# Patient Record
Sex: Male | Born: 1960 | Race: White | Hispanic: No | Smoking: Never smoker
Health system: Southern US, Community
[De-identification: ages and names within clinical notes are randomized; demographics above are authoritative.]

## PROBLEM LIST (undated history)

## (undated) DIAGNOSIS — N200 Calculus of kidney: Secondary | ICD-10-CM

## (undated) HISTORY — PX: APPENDECTOMY: SHX54

---

## 2008-10-24 ENCOUNTER — Emergency Department: Payer: Self-pay | Admitting: Emergency Medicine

## 2010-05-27 ENCOUNTER — Inpatient Hospital Stay: Payer: Self-pay | Admitting: Surgery

## 2013-01-05 ENCOUNTER — Emergency Department: Payer: Self-pay | Admitting: Emergency Medicine

## 2013-01-05 LAB — COMPREHENSIVE METABOLIC PANEL
Albumin: 4.1 g/dL (ref 3.4–5.0)
Anion Gap: 6 — ABNORMAL LOW (ref 7–16)
BUN: 13 mg/dL (ref 7–18)
Bilirubin,Total: 0.7 mg/dL (ref 0.2–1.0)
Calcium, Total: 9.4 mg/dL (ref 8.5–10.1)
Co2: 26 mmol/L (ref 21–32)
Creatinine: 0.95 mg/dL (ref 0.60–1.30)
Glucose: 96 mg/dL (ref 65–99)
Potassium: 3.8 mmol/L (ref 3.5–5.1)
SGOT(AST): 35 U/L (ref 15–37)
SGPT (ALT): 49 U/L (ref 12–78)
Sodium: 138 mmol/L (ref 136–145)
Total Protein: 8 g/dL (ref 6.4–8.2)

## 2013-01-05 LAB — PRO B NATRIURETIC PEPTIDE: B-Type Natriuretic Peptide: 10 pg/mL (ref 0–125)

## 2013-01-05 LAB — CBC
HCT: 46.9 % (ref 40.0–52.0)
HGB: 17.1 g/dL (ref 13.0–18.0)
MCHC: 36.5 g/dL — ABNORMAL HIGH (ref 32.0–36.0)
RBC: 5.2 10*6/uL (ref 4.40–5.90)
WBC: 6.1 10*3/uL (ref 3.8–10.6)

## 2013-01-05 LAB — PROTIME-INR: INR: 0.9

## 2013-01-05 LAB — TROPONIN I
Troponin-I: <0.02 ng/mL
Troponin-I: <0.02 ng/mL

## 2013-01-05 LAB — CK TOTAL AND CKMB (NOT AT ARMC): CK-MB: 3.7 ng/mL — ABNORMAL HIGH (ref 0.5–3.6)

## 2013-05-13 ENCOUNTER — Emergency Department: Payer: Self-pay | Admitting: Emergency Medicine

## 2013-05-13 LAB — COMPREHENSIVE METABOLIC PANEL
Albumin: 4 g/dL (ref 3.4–5.0)
Alkaline Phosphatase: 77 U/L
Anion Gap: 3 — ABNORMAL LOW (ref 7–16)
BUN: 22 mg/dL — ABNORMAL HIGH (ref 7–18)
Bilirubin,Total: 0.6 mg/dL (ref 0.2–1.0)
CALCIUM: 9.5 mg/dL (ref 8.5–10.1)
CHLORIDE: 105 mmol/L (ref 98–107)
CO2: 29 mmol/L (ref 21–32)
CREATININE: 1.03 mg/dL (ref 0.60–1.30)
EGFR (African American): >60
EGFR (Non-African Amer.): >60
Glucose: 94 mg/dL (ref 65–99)
OSMOLALITY: 277 (ref 275–301)
POTASSIUM: 4 mmol/L (ref 3.5–5.1)
SGOT(AST): 34 U/L (ref 15–37)
SGPT (ALT): 56 U/L (ref 12–78)
Sodium: 137 mmol/L (ref 136–145)
TOTAL PROTEIN: 7.8 g/dL (ref 6.4–8.2)

## 2013-05-13 LAB — LIPASE, BLOOD: LIPASE: 149 U/L (ref 73–393)

## 2013-05-13 LAB — URINALYSIS, COMPLETE
BACTERIA: NONE SEEN
Bilirubin,UR: NEGATIVE
GLUCOSE, UR: NEGATIVE mg/dL (ref 0–75)
Ketone: NEGATIVE
NITRITE: NEGATIVE
Ph: 5 (ref 4.5–8.0)
Protein: NEGATIVE
SPECIFIC GRAVITY: 1.021 (ref 1.003–1.030)
SQUAMOUS EPITHELIAL: NONE SEEN

## 2013-05-13 LAB — TROPONIN I

## 2013-05-13 LAB — CBC
HCT: 46.1 % (ref 40.0–52.0)
HGB: 16.1 g/dL (ref 13.0–18.0)
MCH: 32.4 pg (ref 26.0–34.0)
MCHC: 34.9 g/dL (ref 32.0–36.0)
MCV: 93 fL (ref 80–100)
Platelet: 252 10*3/uL (ref 150–440)
RBC: 4.96 10*6/uL (ref 4.40–5.90)
RDW: 12.5 % (ref 11.5–14.5)
WBC: 6.5 10*3/uL (ref 3.8–10.6)

## 2014-08-31 ENCOUNTER — Encounter: Payer: Self-pay | Admitting: Emergency Medicine

## 2014-08-31 ENCOUNTER — Inpatient Hospital Stay
Admission: EM | Admit: 2014-08-31 | Discharge: 2014-09-01 | DRG: 694 | Disposition: A | Discharge status: Home / Self Care | Payer: Self-pay | Attending: Internal Medicine | Admitting: Internal Medicine

## 2014-08-31 ENCOUNTER — Emergency Department: Payer: Self-pay

## 2014-08-31 DIAGNOSIS — N390 Urinary tract infection, site not specified: Secondary | ICD-10-CM

## 2014-08-31 DIAGNOSIS — N201 Calculus of ureter: Secondary | ICD-10-CM

## 2014-08-31 DIAGNOSIS — N179 Acute kidney failure, unspecified: Secondary | ICD-10-CM

## 2014-08-31 DIAGNOSIS — N132 Hydronephrosis with renal and ureteral calculous obstruction: Principal | ICD-10-CM | POA: Diagnosis present

## 2014-08-31 DIAGNOSIS — R109 Unspecified abdominal pain: Secondary | ICD-10-CM | POA: Diagnosis present

## 2014-08-31 DIAGNOSIS — N1330 Unspecified hydronephrosis: Secondary | ICD-10-CM | POA: Diagnosis present

## 2014-08-31 DIAGNOSIS — N200 Calculus of kidney: Secondary | ICD-10-CM

## 2014-08-31 DIAGNOSIS — Z9049 Acquired absence of other specified parts of digestive tract: Secondary | ICD-10-CM | POA: Diagnosis present

## 2014-08-31 DIAGNOSIS — E876 Hypokalemia: Secondary | ICD-10-CM | POA: Diagnosis present

## 2014-08-31 DIAGNOSIS — E860 Dehydration: Secondary | ICD-10-CM | POA: Diagnosis present

## 2014-08-31 DIAGNOSIS — Z87442 Personal history of urinary calculi: Secondary | ICD-10-CM

## 2014-08-31 HISTORY — DX: Calculus of kidney: N20.0

## 2014-08-31 LAB — COMPREHENSIVE METABOLIC PANEL
ALT: 36 U/L (ref 17–63)
AST: 26 U/L (ref 15–41)
Albumin: 4.5 g/dL (ref 3.5–5.0)
Alkaline Phosphatase: 68 U/L (ref 38–126)
Anion gap: 10 (ref 5–15)
BUN: 22 mg/dL — AB (ref 6–20)
CALCIUM: 9.2 mg/dL (ref 8.9–10.3)
CO2: 25 mmol/L (ref 22–32)
Chloride: 106 mmol/L (ref 101–111)
Creatinine, Ser: 1.3 mg/dL — ABNORMAL HIGH (ref 0.61–1.24)
GFR calc non Af Amer: >60 mL/min (ref >60)
Glucose, Bld: 109 mg/dL — ABNORMAL HIGH (ref 65–99)
POTASSIUM: 3.2 mmol/L — AB (ref 3.5–5.1)
Sodium: 141 mmol/L (ref 135–145)
Total Bilirubin: 1 mg/dL (ref 0.3–1.2)
Total Protein: 7.5 g/dL (ref 6.5–8.1)

## 2014-08-31 LAB — CBC WITH DIFFERENTIAL/PLATELET
BASOS PCT: 1 %
Basophils Absolute: 0.1 10*3/uL (ref 0–0.1)
EOS PCT: 2 %
Eosinophils Absolute: 0.2 10*3/uL (ref 0–0.7)
HEMATOCRIT: 44.1 % (ref 40.0–52.0)
Hemoglobin: 15.6 g/dL (ref 13.0–18.0)
LYMPHS ABS: 2.5 10*3/uL (ref 1.0–3.6)
Lymphocytes Relative: 30 %
MCH: 32.4 pg (ref 26.0–34.0)
MCHC: 35.4 g/dL (ref 32.0–36.0)
MCV: 91.3 fL (ref 80.0–100.0)
MONOS PCT: 9 %
Monocytes Absolute: 0.8 10*3/uL (ref 0.2–1.0)
NEUTROS ABS: 4.8 10*3/uL (ref 1.4–6.5)
NEUTROS PCT: 58 %
PLATELETS: 274 10*3/uL (ref 150–440)
RBC: 4.83 MIL/uL (ref 4.40–5.90)
RDW: 12.3 % (ref 11.5–14.5)
WBC: 8.3 10*3/uL (ref 3.8–10.6)

## 2014-08-31 LAB — URINALYSIS COMPLETE WITH MICROSCOPIC (ARMC ONLY)
Bacteria, UA: NONE SEEN
Bilirubin Urine: NEGATIVE
Glucose, UA: NEGATIVE mg/dL
Leukocytes, UA: NEGATIVE
Nitrite: POSITIVE — AB
Protein, ur: 100 mg/dL — AB
Specific Gravity, Urine: 1.027 (ref 1.005–1.030)
Squamous Epithelial / LPF: NONE SEEN
pH: 7 (ref 5.0–8.0)

## 2014-08-31 LAB — LIPASE, BLOOD: Lipase: 24 U/L (ref 22–51)

## 2014-08-31 MED ORDER — TAMSULOSIN HCL 0.4 MG PO CAPS
0.4000 mg | ORAL_CAPSULE | Freq: Once | ORAL | Status: AC
  Administered 2014-08-31: 0.4 mg via ORAL
  Filled 2014-08-31: qty 1

## 2014-08-31 MED ORDER — ONDANSETRON HCL 4 MG/2ML IJ SOLN
4.0000 mg | Freq: Once | INTRAMUSCULAR | Status: AC
  Administered 2014-08-31: 4 mg via INTRAVENOUS

## 2014-08-31 MED ORDER — SODIUM CHLORIDE 0.9 % IV SOLN
INTRAVENOUS | Status: DC
  Administered 2014-08-31 – 2014-09-01 (×2): via INTRAVENOUS

## 2014-08-31 MED ORDER — HYDROMORPHONE HCL 1 MG/ML IJ SOLN
1.0000 mg | Freq: Once | INTRAMUSCULAR | Status: AC
  Administered 2014-08-31: 1 mg via INTRAVENOUS

## 2014-08-31 MED ORDER — CIPROFLOXACIN IN D5W 400 MG/200ML IV SOLN: 400.0000 mg | Freq: Once | INTRAVENOUS | Status: DC

## 2014-08-31 MED ORDER — ONDANSETRON HCL 4 MG PO TABS: 4.0000 mg | ORAL_TABLET | Freq: Four times a day (QID) | ORAL | Status: DC | PRN

## 2014-08-31 MED ORDER — HYDROMORPHONE HCL 1 MG/ML IJ SOLN
INTRAMUSCULAR | Status: AC
  Administered 2014-08-31: 1 mg via INTRAVENOUS
  Filled 2014-08-31: qty 1

## 2014-08-31 MED ORDER — KETOROLAC TROMETHAMINE 30 MG/ML IJ SOLN
30.0000 mg | Freq: Once | INTRAMUSCULAR | Status: AC
  Administered 2014-08-31: 30 mg via INTRAVENOUS
  Filled 2014-08-31: qty 1

## 2014-08-31 MED ORDER — ONDANSETRON HCL 4 MG/2ML IJ SOLN
INTRAMUSCULAR | Status: AC
  Administered 2014-08-31: 4 mg via INTRAVENOUS
  Filled 2014-08-31: qty 2

## 2014-08-31 MED ORDER — OXYCODONE HCL 5 MG PO TABS
5.0000 mg | ORAL_TABLET | ORAL | Status: DC | PRN
  Administered 2014-08-31 – 2014-09-01 (×3): 5 mg via ORAL
  Filled 2014-08-31 (×3): qty 1

## 2014-08-31 MED ORDER — ONDANSETRON HCL 4 MG/2ML IJ SOLN
4.0000 mg | Freq: Four times a day (QID) | INTRAMUSCULAR | Status: DC | PRN
  Administered 2014-09-01: 07:00:00 4 mg via INTRAVENOUS
  Filled 2014-08-31: qty 2

## 2014-08-31 MED ORDER — ACETAMINOPHEN 650 MG RE SUPP: 650.0000 mg | Freq: Four times a day (QID) | RECTAL | Status: DC | PRN

## 2014-08-31 MED ORDER — CEFTRIAXONE SODIUM IN DEXTROSE 20 MG/ML IV SOLN
1.0000 g | INTRAVENOUS | Status: DC
  Administered 2014-08-31: 1 g via INTRAVENOUS
  Filled 2014-08-31 (×2): qty 50

## 2014-08-31 MED ORDER — SODIUM CHLORIDE 0.9 % IV BOLUS (SEPSIS)
1000.0000 mL | Freq: Once | INTRAVENOUS | Status: AC
  Administered 2014-08-31: 1000 mL via INTRAVENOUS

## 2014-08-31 MED ORDER — HYDROMORPHONE HCL 1 MG/ML IJ SOLN
1.0000 mg | INTRAMUSCULAR | Status: DC | PRN
  Administered 2014-09-01: 07:00:00 1 mg via INTRAVENOUS
  Filled 2014-08-31: qty 1

## 2014-08-31 MED ORDER — ACETAMINOPHEN 325 MG PO TABS: 650.0000 mg | ORAL_TABLET | Freq: Four times a day (QID) | ORAL | Status: DC | PRN

## 2014-08-31 NOTE — ED Provider Notes (Signed)
[facility] Emergency Department Provider Note  ____________________________________________  Time seen: Upon arrival to the emergency department  I have reviewed the triage vital signs and the nursing notes.   HISTORY  Chief Complaint Flank Pain    HPI Sean Norton is a 54 y.o. male with a history of multiple kidney stones presents with left flank and left-sided abdominal pain over the past week. He said the pain started last Thursday in his left flank and has progressed, becoming more severe. He said today that his pain is severe to left flank and radiating around to his left side of the abdomen. He had an episode of vomiting at home. No diarrhea. Has never needed to procedures in the past to pass his stones. Does not have a urologist or primary care doctor. Denies any blood in his urine or difficulty urinating. Did say he took several Azo's at home because he says that helped last time he had a kidney stone.   Past Medical History  Diagnosis Date  . Kidney stones     There are no active problems to display for this patient.   Past Surgical History  Procedure Laterality Date  . Appendectomy      No current outpatient prescriptions on file.  Allergies Review of patient's allergies indicates no known allergies.  History reviewed. No pertinent family history.  Social History History  Substance Use Topics  . Smoking status: Never Smoker   . Smokeless tobacco: Never Used  . Alcohol Use: No    Review of Systems Constitutional: No fever/chills Eyes: No visual changes. ENT: No sore throat. Cardiovascular: Denies chest pain. Respiratory: Denies shortness of breath. Gastrointestinal: Entry: Left-sided abdominal pain  No diarrhea.  No constipation. Genitourinary: Negative for dysuria. Musculoskeletal: Left-sided pain over the CVA Skin: Negative for rash. Neurological: Negative for headaches, focal weakness or numbness.  10-point ROS otherwise  negative.  ____________________________________________   PHYSICAL EXAM:  VITAL SIGNS: ED Triage Vitals  Enc Vitals Group     BP 08/31/14 1951 136/103 mmHg     Pulse Rate 08/31/14 1951 71     Resp 08/31/14 1951 28     Temp 08/31/14 1951 97.7 F (36.5 C)     Temp Source 08/31/14 1951 Oral     SpO2 08/31/14 1951 100 %     Weight 08/31/14 1951 255 lb (115.667 kg)     Height 08/31/14 1951 6\' 3"  (1.905 m)     Head Cir --      Peak Flow --      Pain Score 08/31/14 1952 10     Pain Loc --      Pain Edu? --      Excl. in Center? --     Constitutional: Alert and oriented. Moderate to severe distress.  Eyes: Conjunctivae are normal. PERRL. EOMI. Head: Atraumatic. Nose: No congestion/rhinnorhea. Mouth/Throat: Mucous membranes are moist.  Oropharynx non-erythematous. Neck: No stridor.   Cardiovascular: Normal rate, regular rhythm. Grossly normal heart sounds.  Good peripheral circulation. Respiratory: Normal respiratory effort.  No retractions. Lungs CTAB. Gastrointestinal: Soft with diffuse tenderness to palpation which is worst in the left lower quadrant.  No rebound. No distention. No abdominal bruits. No CVA tenderness. Musculoskeletal: No lower extremity tenderness nor edema.  No joint effusions. Neurologic:  Normal speech and language. No gross focal neurologic deficits are appreciated. No gait instability. Skin:  Skin is warm, dry and intact. No rash noted. Psychiatric: Mood and affect are normal. Speech and behavior are  normal.  ____________________________________________   LABS (all labs ordered are listed, but only abnormal results are displayed)  Labs Reviewed  URINALYSIS COMPLETEWITH MICROSCOPIC ([facility]) - Abnormal; Notable for the following:    Color, Urine AMBER (*)    APPearance CLEAR (*)    Ketones, ur TRACE (*)    Hgb urine dipstick 2+ (*)    Protein, ur 100 (*)    Nitrite POSITIVE (*)    All other components within normal limits  COMPREHENSIVE METABOLIC  PANEL - Abnormal; Notable for the following:    Potassium 3.2 (*)    Glucose, Bld 109 (*)    BUN 22 (*)    Creatinine, Ser 1.30 (*)    All other components within normal limits  CBC WITH DIFFERENTIAL/PLATELET  LIPASE, BLOOD   ____________________________________________  EKG   ____________________________________________  RADIOLOGY  6 mm left UVJ stone with mild Hydro. ____________________________________________   PROCEDURES    ____________________________________________   INITIAL IMPRESSION / ASSESSMENT AND PLAN / ED COURSE  Pertinent labs & imaging results that were available during my care of the patient were reviewed by me and considered in my medical decision making (see chart for details).  ----------------------------------------- 9:29 PM on 08/31/2014 -----------------------------------------  Patient still with severe sharp pain and vomiting after 2 mg of Dilaudid and 30 mg of Toradol. Wrote for Flomax as well. We'll admit to the hospital. Discussed cased with Dr. Junious Silk of urology who is aware of the patient. Admitting for intractable pain secondary to kidney stone. Also appears that the urine is infected. Signed out to Dr. Volanda Napoleon. ____________________________________________   FINAL CLINICAL IMPRESSION(S) / ED DIAGNOSES  Acute ureterolithiasis with UTI. Initial visit.    [name], MD 08/31/14 2130

## 2014-08-31 NOTE — H&P (Signed)
[place] [facility] [place] NAME: Sean Norton    MR#:  [id number]  DATE OF BIRTH:  09/13/1960   DATE OF ADMISSION:  08/31/2014  PRIMARY CARE PHYSICIAN: No primary care provider on file.   REQUESTING/REFERRING PHYSICIAN: Schaevitz  CHIEF COMPLAINT:   Chief Complaint  Patient presents with  . Flank Pain    HISTORY OF PRESENT ILLNESS:  Sean Norton  is a 54 y.o. male with a known history of nephrolithiasis 6 presented left-sided flank pain. Describes approximately 1 week duration left-sided flank pain, sharp/stabbing in quality, radiation down to the groin, no worsening or relieving factors, associated dysuria, intensity 10/10. Denies fevers or chills Emergency course: Found to have a 6 mm left sided nephrolithiasis with associated mild hydronephrosis case discussed with urology on-call  PAST MEDICAL HISTORY:   Past Medical History  Diagnosis Date  . Kidney stones     PAST SURGICAL HISTORY:   Past Surgical History  Procedure Laterality Date  . Appendectomy      SOCIAL HISTORY:   History  Substance Use Topics  . Smoking status: Never Smoker   . Smokeless tobacco: Never Used  . Alcohol Use: No    FAMILY HISTORY:   Family History  Problem Relation Age of Onset  . Diabetes Neg Hx     DRUG ALLERGIES:  No Known Allergies  REVIEW OF SYSTEMS:  REVIEW OF SYSTEMS:  CONSTITUTIONAL: Denies fevers, chills, positive fatigue, weakness.  EYES: Denies blurred vision, double vision, or eye pain.  EARS, NOSE, THROAT: Denies tinnitus, ear pain, hearing loss.  RESPIRATORY: denies cough, shortness of breath, wheezing  CARDIOVASCULAR: Denies chest pain, palpitations, edema.  GASTROINTESTINAL: Denies nausea, vomiting, diarrhea, abdominal pain.  GENITOURINARY: Positive dysuria, denies hematuria.  ENDOCRINE: Denies nocturia or thyroid problems. HEMATOLOGIC AND LYMPHATIC: Denies easy bruising or bleeding.  SKIN: Denies rash or  lesions.  MUSCULOSKELETAL: Denies pain in neck, back, shoulder, knees, hips, or further arthritic symptoms.  NEUROLOGIC: Denies paralysis, paresthesias.  PSYCHIATRIC: Denies anxiety or depressive symptoms. Otherwise full review of systems performed by me is negative.   MEDICATIONS AT HOME:   Prior to Admission medications   Not on File      VITAL SIGNS:  Blood pressure 123/83, pulse 78, temperature 97.7 F (36.5 C), temperature source Oral, resp. rate 18, height 6\' 3"  (1.905 m), weight 255 lb (115.667 kg), SpO2 94 %.  PHYSICAL EXAMINATION:  VITAL SIGNS: Filed Vitals:   08/31/14 2130  BP: 123/83  Pulse: 78  Temp:   Resp: 18   GENERAL:53 y.o.male currently in mild/moderate distress given pain HEAD: Normocephalic, atraumatic.  EYES: Pupils equal, round, reactive to light. Extraocular muscles intact. No scleral icterus.  MOUTH: Moist mucosal membrane. Dentition intact. No abscess noted.  EAR, NOSE, THROAT: Clear without exudates. No external lesions.  NECK: Supple. No thyromegaly. No nodules. No JVD.  PULMONARY: Clear to ascultation, without wheeze rails or rhonci. No use of accessory muscles, Good respiratory effort. good air entry bilaterally CHEST: Nontender to palpation.  CARDIOVASCULAR: S1 and S2. Regular rate and rhythm. No murmurs, rubs, or gallops. No edema. Pedal pulses 2+ bilaterally.  GASTROINTESTINAL: Soft, nontender, nondistended. No masses. Positive bowel sounds. No hepatosplenomegaly. As a left CVA tenderness MUSCULOSKELETAL: No swelling, clubbing, or edema. Range of motion full in all extremities.  NEUROLOGIC: Cranial nerves II through XII are intact. No gross focal neurological deficits. Sensation intact. Reflexes intact.  SKIN: No ulceration, lesions, rashes, or cyanosis. Skin warm and dry.  Turgor intact.  PSYCHIATRIC: Mood, affect within normal limits. The patient is awake, alert and oriented x 3. Insight, judgment intact.    LABORATORY PANEL:    CBC  Recent Labs Lab 08/31/14 1958  WBC 8.3  HGB 15.6  HCT 44.1  PLT 274   ------------------------------------------------------------------------------------------------------------------  Chemistries   Recent Labs Lab 08/31/14 1958  NA 141  K 3.2*  CL 106  CO2 25  GLUCOSE 109*  BUN 22*  CREATININE 1.30*  CALCIUM 9.2  AST 26  ALT 36  ALKPHOS 68  BILITOT 1.0   ------------------------------------------------------------------------------------------------------------------  Cardiac Enzymes No results for input(s): TROPONINI in the last 168 hours. ------------------------------------------------------------------------------------------------------------------  RADIOLOGY:  Ct Renal Stone Study  08/31/2014   CLINICAL DATA:  Vomiting and left flank pain for 1 week  EXAM: CT ABDOMEN AND PELVIS WITHOUT CONTRAST  TECHNIQUE: Multidetector CT imaging of the abdomen and pelvis was performed following the standard protocol without IV contrast.  COMPARISON:  None.  FINDINGS: Lung bases are free of acute infiltrate or sizable effusion.  The liver, gallbladder, spleen, adrenal glands and pancreas are within normal limits. The right kidney is well visualized without renal calculi or obstructive changes. The right ureter is unremarkable. Left kidney demonstrates mild hydronephrosis extending to the level of the distal ureter. There is a 6 mm stone identified at the left UVJ causing the obstructive change. The bladder is decompressed. No pelvic mass lesion or sidewall abnormality is seen. The appendix has been surgically removed. No acute bony abnormality is noted.  IMPRESSION: Left ureterovesical junction stone measuring 6 mm causing mild hydronephrosis.   Electronically Signed   By: Inez Catalina M.D.   On: 08/31/2014 20:23    EKG:   Orders placed or performed in visit on 05/13/13  . EKG 12-Lead    IMPRESSION AND PLAN:   89 year Caucasian gentleman history of nephrolithiasis  percent left-sided flank pain.  1. Intractable pain secondary to left sided nephrolithiasis mild hydronephrosis: Case discussed with urology by ED staff, provide pain medication, Zofran, and about a coverage with ceftriaxone 2. Acute kidney injury: IV fluid hydration 3. Hypokalemia: Replace goal 4-5 4. Venous thromboembolism prophylactic: SCD     All the records are reviewed and case discussed with ED provider. Management plans discussed with the patient, family and they are in agreement.  CODE STATUS: Full  TOTAL TIME TAKING CARE OF THIS PATIENT: 35 minutes.    Narcissus Detwiler,  Karenann Cai.D on 08/31/2014 at 9:47 PM  Between 7am to 6pm - Pager - [phone]  After 6pm: House Pager: - [phone]  [facility]  Office  [phone]  CC: Primary care physician; No primary care provider on file.

## 2014-08-31 NOTE — ED Notes (Signed)
Pt presents to ED with vomiting and left flank pain for almost a week. Symptoms worsened today. Hx of kidney stones. Pt was assisted from car by this nurse; when offered a wheelchair pt placed himself on his hands and knees; very restless. Pt states his pain is severe and is groaning loudly. MD aware.

## 2014-08-31 NOTE — ED Notes (Signed)
Report attempted 

## 2014-08-31 NOTE — ED Notes (Signed)
Called wife, Avyon Herendeen at [phone], [phone], for questions and updates per pt request.

## 2014-09-01 ENCOUNTER — Encounter
Admission: EM | Disposition: A | Discharge status: Home / Self Care | Payer: Self-pay | Source: Home / Self Care | Attending: Internal Medicine

## 2014-09-01 ENCOUNTER — Inpatient Hospital Stay: Payer: Self-pay | Admitting: Anesthesiology

## 2014-09-01 ENCOUNTER — Encounter: Payer: Self-pay | Admitting: Anesthesiology

## 2014-09-01 HISTORY — PX: CYSTOSCOPY WITH STENT PLACEMENT: SHX5790

## 2014-09-01 HISTORY — PX: URETEROSCOPY WITH HOLMIUM LASER LITHOTRIPSY: SHX6645

## 2014-09-01 LAB — COMPREHENSIVE METABOLIC PANEL
ALK PHOS: 63 U/L (ref 38–126)
ALT: 31 U/L (ref 17–63)
ANION GAP: 9 (ref 5–15)
AST: 24 U/L (ref 15–41)
Albumin: 4 g/dL (ref 3.5–5.0)
BUN: 26 mg/dL — ABNORMAL HIGH (ref 6–20)
CHLORIDE: 106 mmol/L (ref 101–111)
CO2: 27 mmol/L (ref 22–32)
Calcium: 8.8 mg/dL — ABNORMAL LOW (ref 8.9–10.3)
Creatinine, Ser: 1.43 mg/dL — ABNORMAL HIGH (ref 0.61–1.24)
GFR calc Af Amer: >60 mL/min (ref >60)
GFR calc non Af Amer: 55 mL/min — ABNORMAL LOW (ref >60)
Glucose, Bld: 112 mg/dL — ABNORMAL HIGH (ref 65–99)
POTASSIUM: 4.3 mmol/L (ref 3.5–5.1)
Sodium: 142 mmol/L (ref 135–145)
TOTAL PROTEIN: 7 g/dL (ref 6.5–8.1)
Total Bilirubin: 0.9 mg/dL (ref 0.3–1.2)

## 2014-09-01 LAB — CBC
HCT: 42.4 % (ref 40.0–52.0)
HEMOGLOBIN: 14.5 g/dL (ref 13.0–18.0)
MCH: 31.8 pg (ref 26.0–34.0)
MCHC: 34.2 g/dL (ref 32.0–36.0)
MCV: 93 fL (ref 80.0–100.0)
Platelets: 225 10*3/uL (ref 150–440)
RBC: 4.56 MIL/uL (ref 4.40–5.90)
RDW: 12.4 % (ref 11.5–14.5)
WBC: 7.4 10*3/uL (ref 3.8–10.6)

## 2014-09-01 SURGERY — CYSTOSCOPY, WITH STENT INSERTION
Anesthesia: General | Laterality: Left | Wound class: Clean Contaminated

## 2014-09-01 MED ORDER — IOTHALAMATE MEGLUMINE 17.2 % UR SOLN
URETHRAL | Status: DC | PRN
  Administered 2014-09-01: 15 mL via URETHRAL

## 2014-09-01 MED ORDER — OXYCODONE HCL 5 MG/5ML PO SOLN: 5.0000 mg | Freq: Once | ORAL | Status: DC | PRN

## 2014-09-01 MED ORDER — GLYCOPYRROLATE 0.2 MG/ML IJ SOLN
INTRAMUSCULAR | Status: DC | PRN
  Administered 2014-09-01: 0.2 mg via INTRAVENOUS

## 2014-09-01 MED ORDER — FENTANYL CITRATE (PF) 100 MCG/2ML IJ SOLN: 25.0000 ug | INTRAMUSCULAR | Status: DC | PRN

## 2014-09-01 MED ORDER — LIDOCAINE HCL (CARDIAC) 20 MG/ML IV SOLN
INTRAVENOUS | Status: DC | PRN
  Administered 2014-09-01: 80 mg via INTRAVENOUS

## 2014-09-01 MED ORDER — PROPOFOL 10 MG/ML IV BOLUS
INTRAVENOUS | Status: DC | PRN
  Administered 2014-09-01: 200 mg via INTRAVENOUS

## 2014-09-01 MED ORDER — NALOXONE HCL 0.4 MG/ML IJ SOLN
INTRAMUSCULAR | Status: DC | PRN
  Administered 2014-09-01: 80 ug via INTRAVENOUS

## 2014-09-01 MED ORDER — LACTATED RINGERS IV SOLN
INTRAVENOUS | Status: DC | PRN
  Administered 2014-09-01: 14:00:00 via INTRAVENOUS

## 2014-09-01 MED ORDER — ONDANSETRON HCL 4 MG/2ML IJ SOLN
4.0000 mg | INTRAMUSCULAR | Status: DC | PRN
  Administered 2014-09-01: 11:00:00 4 mg via INTRAVENOUS
  Filled 2014-09-01: qty 2

## 2014-09-01 MED ORDER — SUCCINYLCHOLINE CHLORIDE 20 MG/ML IJ SOLN
INTRAMUSCULAR | Status: DC | PRN
  Administered 2014-09-01: 100 mg via INTRAVENOUS

## 2014-09-01 MED ORDER — FENTANYL CITRATE (PF) 250 MCG/5ML IJ SOLN
INTRAMUSCULAR | Status: DC | PRN
  Administered 2014-09-01: 100 ug via INTRAVENOUS

## 2014-09-01 MED ORDER — IPRATROPIUM-ALBUTEROL 0.5-2.5 (3) MG/3ML IN SOLN
3.0000 mL | Freq: Once | RESPIRATORY_TRACT | Status: AC
  Administered 2014-09-01: 3 mL via RESPIRATORY_TRACT

## 2014-09-01 MED ORDER — HYDROMORPHONE HCL 1 MG/ML IJ SOLN
1.0000 mg | INTRAMUSCULAR | Status: DC | PRN
  Administered 2014-09-01 (×2): 1 mg via INTRAVENOUS
  Filled 2014-09-01 (×2): qty 1

## 2014-09-01 MED ORDER — ACETAMINOPHEN 10 MG/ML IV SOLN
INTRAVENOUS | Status: DC | PRN
  Administered 2014-09-01: 1000 mg via INTRAVENOUS

## 2014-09-01 MED ORDER — CEPHALEXIN 500 MG PO CAPS: 500.0000 mg | ORAL_CAPSULE | Freq: Four times a day (QID) | ORAL | Status: DC

## 2014-09-01 MED ORDER — ONDANSETRON HCL 4 MG/2ML IJ SOLN
INTRAMUSCULAR | Status: DC | PRN
  Administered 2014-09-01: 4 mg via INTRAVENOUS

## 2014-09-01 MED ORDER — MIDAZOLAM HCL 5 MG/5ML IJ SOLN
INTRAMUSCULAR | Status: DC | PRN
  Administered 2014-09-01: 2 mg via INTRAVENOUS

## 2014-09-01 MED ORDER — OXYCODONE HCL 5 MG PO TABS: 5.0000 mg | ORAL_TABLET | Freq: Once | ORAL | Status: DC | PRN

## 2014-09-01 MED ORDER — DEXAMETHASONE SODIUM PHOSPHATE 10 MG/ML IJ SOLN
INTRAMUSCULAR | Status: DC | PRN
  Administered 2014-09-01: 10 mg via INTRAVENOUS

## 2014-09-01 MED ORDER — ONDANSETRON HCL 4 MG PO TABS: 4.0000 mg | ORAL_TABLET | ORAL | Status: DC | PRN

## 2014-09-01 MED ORDER — CEFTRIAXONE SODIUM IN DEXTROSE 20 MG/ML IV SOLN
1.0000 g | INTRAVENOUS | Status: DC
  Filled 2014-09-01: qty 50

## 2014-09-01 SURGICAL SUPPLY — 25 items
BASKET LASER NITINOL 1.9FR (BASKET) ×2 IMPLANT
BASKET NITINOL 4 WIRE 16 (BASKET) IMPLANT
BASKET ZERO TIP 1.9FR (BASKET) IMPLANT
CATH FOL LX CONE TIP  8F (CATHETERS) ×1
CATH FOL LX CONE TIP 8F (CATHETERS) ×1 IMPLANT
CATH URETL 5X70 OPEN END (CATHETERS) ×2 IMPLANT
CNTNR SPEC 2.5X3XGRAD LEK (MISCELLANEOUS) ×1
CONRAY 43 FOR UROLOGY 50M (MISCELLANEOUS) ×2 IMPLANT
CONT SPEC 4OZ STER OR WHT (MISCELLANEOUS) ×1
CONTAINER SPEC 2.5X3XGRAD LEK (MISCELLANEOUS) ×1 IMPLANT
DRSG TEGADERM 2-3/8X2-3/4 SM (GAUZE/BANDAGES/DRESSINGS) ×2 IMPLANT
FEE TECHNICIAN ONLY PER HOUR (MISCELLANEOUS) ×2 IMPLANT
GLOVE BIO SURGEON STRL SZ7.5 (GLOVE) ×2 IMPLANT
GOWN L4 LG 24 PK N/S (GOWN DISPOSABLE) ×2 IMPLANT
GOWN STRL REUS W/ TWL XL LVL3 (GOWN DISPOSABLE) ×1 IMPLANT
GOWN STRL REUS W/TWL XL LVL3 (GOWN DISPOSABLE) ×1
LASER HOLMIUM FIBER SU 272UM (MISCELLANEOUS) IMPLANT
PACK CYSTO AR (MISCELLANEOUS) ×2 IMPLANT
PREP PVP WINGED SPONGE (MISCELLANEOUS) ×2 IMPLANT
SENSORWIRE 0.038 NOT ANGLED (WIRE) ×2
SET CYSTO W/LG BORE CLAMP LF (SET/KITS/TRAYS/PACK) ×2 IMPLANT
SOL PREP PVP 2OZ (MISCELLANEOUS) ×2
SOLUTION PREP PVP 2OZ (MISCELLANEOUS) ×1 IMPLANT
SYRINGE IRR TOOMEY STRL 70CC (SYRINGE) ×2 IMPLANT
WIRE SENSOR 0.038 NOT ANGLED (WIRE) ×1 IMPLANT

## 2014-09-01 NOTE — Anesthesia Preprocedure Evaluation (Signed)
Anesthesia Evaluation  Patient identified by MRN, date of birth, ID band Patient awake    Reviewed: Allergy & Precautions, H&P , NPO status , Patient's Chart, lab work & pertinent test results, reviewed documented beta blocker date and time   Airway Mallampati: II  TM Distance: >3 FB Neck ROM: full    Dental no notable dental hx. (+) Teeth Intact   Pulmonary neg pulmonary ROS,  breath sounds clear to auscultation  Pulmonary exam normal       Cardiovascular - Past MI negative cardio ROS Normal cardiovascular examRhythm:regular Rate:Normal     Neuro/Psych negative neurological ROS  negative psych ROS   GI/Hepatic Neg liver ROS, GERD-  Controlled,  Endo/Other  negative endocrine ROS  Renal/GU Renal disease  negative genitourinary   Musculoskeletal   Abdominal   Peds  Hematology negative hematology ROS (+)   Anesthesia Other Findings Past Medical History:   Kidney stones                                                Reproductive/Obstetrics negative OB ROS                             Anesthesia Physical Anesthesia Plan  ASA: II  Anesthesia Plan: General   Post-op Pain Management:    Induction:   Airway Management Planned:   Additional Equipment:   Intra-op Plan:   Post-operative Plan:   Informed Consent: I have reviewed the patients History and Physical, chart, labs and discussed the procedure including the risks, benefits and alternatives for the proposed anesthesia with the patient or authorized representative who has indicated his/her understanding and acceptance.   Dental Advisory Given  Plan Discussed with: Anesthesiologist, CRNA and Surgeon  Anesthesia Plan Comments:         Anesthesia Quick Evaluation

## 2014-09-01 NOTE — Op Note (Signed)
PROCEDURE:  1 - Cystoscopy with left ureteroscopy + laser lithotripsy and insertion 6x26 stent with tether.  SURGEON: Rulon Abide MD  INDICATION: Left Ureteral stone with renal insuficiency and refracotry pain.   FINDINGS: 1 - Mild left hydro to left distal stone 2 -Resolution fo stone following laser and basketing 3 - Placement of left stent proximal end renal pelvis, distal end urinary bladder.  DRAINS: none  NARRATIVE:  After induction of GETA and prepping penis, perineum with iodine, cysto performed with 69F rigid scope. Urethra unremakrbale. Bilat UO's singlewon. Bladder w/o lesions.   36F end hole to left UO and retrograde revealed mild hydro to distal filling deffect c/w known stone. Marland Kitchen035 glide wire used as safety wire  Semirigid ureteroscopy performed of distal 1/3 ureter alson sensor working wire. Fusiform stone encountered in distal ureter, too large for simple basketing.Holmium laster applied 416mJ / 12Hz  fragmenting into 3 smaller pieces that were brought to bladder using escape baseket and flushed via cystoscope sheath.   [id number] stent placed using cystoscopic and fluorscopic guidance over remainidng safety wire and tether fashioned to dorsal penis.  Pt to PACU stable.

## 2014-09-01 NOTE — Progress Notes (Signed)
Dr. Anselm Jungling notified pt unable to tolerate Dilauid w/ pain 10/10.  Dilaudid given at 0721 and Pt states no pain relief at this time. MD aware stating will assess chart and place new orders.

## 2014-09-01 NOTE — Progress Notes (Signed)
Dr. Anselm Jungling notified pt has n/v with Dilaudid. Verbal order read back & verified to modify Zofran Q4H PRN. Will continue to assess.   Hiram Gash [employer]

## 2014-09-01 NOTE — Progress Notes (Signed)
Collaboration with Agricultural consultant to help pt understand MD will not write note. On Call MD paged to see if they will write a note stating when pt can return to work, which is anytime since there are no restrictions written on discharge paperwork and MD notes.

## 2014-09-01 NOTE — Transfer of Care (Signed)
Immediate Anesthesia Transfer of Care Note  Patient: Sean Norton  Procedure(s) Performed: Procedure(s): CYSTOSCOPY WITH STENT PLACEMENT (Left) URETEROSCOPY WITH HOLMIUM LASER LITHOTRIPSY (Left)  Patient Location: PACU  Anesthesia Type:General  Level of Consciousness: sedated  Airway & Oxygen Therapy: Patient Spontanous Breathing and Patient connected to face mask oxygen  Post-op Assessment: Report given to RN and Post -op Vital signs reviewed and stable  Post vital signs: Reviewed and stable  Last Vitals:  Filed Vitals:   09/01/14 1510  BP: 135/78  Pulse: 76  Temp:   Resp: 14    Complications: No apparent anesthesia complications

## 2014-09-01 NOTE — Anesthesia Procedure Notes (Signed)
Procedure Name: Intubation Date/Time: 09/01/2014 2:09 PM Performed by: Delaney Meigs Pre-anesthesia Checklist: Patient identified, Emergency Drugs available, Suction available, Patient being monitored and Timeout performed Patient Re-evaluated:Patient Re-evaluated prior to inductionOxygen Delivery Method: Circle system utilized Preoxygenation: Pre-oxygenation with 100% oxygen Intubation Type: IV induction, Cricoid Pressure applied and Rapid sequence Ventilation: Mask ventilation without difficulty Laryngoscope Size: Mac and 3 Grade View: Grade I Tube type: Oral Tube size: 7.5 mm Number of attempts: 1 Airway Equipment and Method: Stylet Placement Confirmation: ETT inserted through vocal cords under direct vision,  positive ETCO2 and breath sounds checked- equal and bilateral Secured at: 21 cm Tube secured with: Tape Dental Injury: Teeth and Oropharynx as per pre-operative assessment

## 2014-09-01 NOTE — Consult Note (Signed)
Reason for Consult: Left Distal Ureteral Stone with Refractory Pain, Acute Renal Failure  Referring Physician: Adelina Mings MD  EVART MCDONNELL is an 54 y.o. male.   HPI:   1 - Left Distal Ureteral Stone with Refractory Pain - Left 70m UVJ stone with mild-mod hydro by ER CT 08/1314 on eval abd pain. No fevers. NO additional stone. Pt admitted for pain control and req multiple round IV pain meds.  He has h/o "at least" 6 prior stones all managed medically.   2 - Acute Renal Failure - Baseline Cr <1. Cr 1.4 noted on AM labs 7/14. Has known left sotne with mild hydro as well as poor PO intake due to emesis. No right sided hydro.  PMH sig for appy. No CV disease. No blood thinners.   Today " TAbdulhadi is seen as urgent consult for above. He is NPO.   Past Medical History  Diagnosis Date  . Kidney stones     Past Surgical History  Procedure Laterality Date  . Appendectomy      Family History  Problem Relation Age of Onset  . Diabetes Neg Hx     Social History:  reports that he has never smoked. He has never used smokeless tobacco. He reports that he does not drink alcohol or use illicit drugs.  Allergies: No Known Allergies  Medications: I have reviewed the patient's current medications.  Results for orders placed or performed during the hospital encounter of 08/31/14 (from the past 48 hour(s))  CBC with Differential     Status: None   Collection Time: 08/31/14  7:58 PM  Result Value Ref Range   WBC 8.3 3.8 - 10.6 K/uL   RBC 4.83 4.40 - 5.90 MIL/uL   Hemoglobin 15.6 13.0 - 18.0 g/dL   HCT 44.1 40.0 - 52.0 %   MCV 91.3 80.0 - 100.0 fL   MCH 32.4 26.0 - 34.0 pg   MCHC 35.4 32.0 - 36.0 g/dL   RDW 12.3 11.5 - 14.5 %   Platelets 274 150 - 440 K/uL   Neutrophils Relative % 58 %   Neutro Abs 4.8 1.4 - 6.5 K/uL   Lymphocytes Relative 30 %   Lymphs Abs 2.5 1.0 - 3.6 K/uL   Monocytes Relative 9 %   Monocytes Absolute 0.8 0.2 - 1.0 K/uL   Eosinophils Relative 2 %   Eosinophils  Absolute 0.2 0 - 0.7 K/uL   Basophils Relative 1 %   Basophils Absolute 0.1 0 - 0.1 K/uL  Comprehensive metabolic panel     Status: Abnormal   Collection Time: 08/31/14  7:58 PM  Result Value Ref Range   Sodium 141 135 - 145 mmol/L   Potassium 3.2 (L) 3.5 - 5.1 mmol/L   Chloride 106 101 - 111 mmol/L   CO2 25 22 - 32 mmol/L   Glucose, Bld 109 (H) 65 - 99 mg/dL   BUN 22 (H) 6 - 20 mg/dL   Creatinine, Ser 1.30 (H) 0.61 - 1.24 mg/dL   Calcium 9.2 8.9 - 10.3 mg/dL   Total Protein 7.5 6.5 - 8.1 g/dL   Albumin 4.5 3.5 - 5.0 g/dL   AST 26 15 - 41 U/L   ALT 36 17 - 63 U/L   Alkaline Phosphatase 68 38 - 126 U/L   Total Bilirubin 1.0 0.3 - 1.2 mg/dL   GFR calc non Af Amer >60 >60 mL/min   GFR calc Af Amer >60 >60 mL/min    Comment: (NOTE) The eGFR  has been calculated using the CKD EPI equation. This calculation has not been validated in all clinical situations. eGFR's persistently <60 mL/min signify possible Chronic Kidney Disease.    Anion gap 10 5 - 15  Lipase, blood     Status: None   Collection Time: 08/31/14  7:58 PM  Result Value Ref Range   Lipase 24 22 - 51 U/L  Urinalysis complete, with microscopic ([facility] only)     Status: Abnormal   Collection Time: 08/31/14  8:19 PM  Result Value Ref Range   Color, Urine AMBER (A) YELLOW    Comment: BIOCHEMICALS MAY BE AFFECTED BY COLOR   APPearance CLEAR (A) CLEAR   Glucose, UA NEGATIVE NEGATIVE mg/dL   Bilirubin Urine NEGATIVE NEGATIVE   Ketones, ur TRACE (A) NEGATIVE mg/dL   Specific Gravity, Urine 1.027 1.005 - 1.030   Hgb urine dipstick 2+ (A) NEGATIVE   pH 7.0 5.0 - 8.0   Protein, ur 100 (A) NEGATIVE mg/dL   Nitrite POSITIVE (A) NEGATIVE   Leukocytes, UA NEGATIVE NEGATIVE   RBC / HPF TOO NUMEROUS TO COUNT 0 - 5 RBC/hpf   WBC, UA 0-5 0 - 5 WBC/hpf   Bacteria, UA NONE SEEN NONE SEEN   Squamous Epithelial / LPF NONE SEEN NONE SEEN   Mucous PRESENT   Comprehensive metabolic panel     Status: Abnormal   Collection Time: 09/01/14   4:52 AM  Result Value Ref Range   Sodium 142 135 - 145 mmol/L   Potassium 4.3 3.5 - 5.1 mmol/L   Chloride 106 101 - 111 mmol/L   CO2 27 22 - 32 mmol/L   Glucose, Bld 112 (H) 65 - 99 mg/dL   BUN 26 (H) 6 - 20 mg/dL   Creatinine, Ser 1.43 (H) 0.61 - 1.24 mg/dL   Calcium 8.8 (L) 8.9 - 10.3 mg/dL   Total Protein 7.0 6.5 - 8.1 g/dL   Albumin 4.0 3.5 - 5.0 g/dL   AST 24 15 - 41 U/L   ALT 31 17 - 63 U/L   Alkaline Phosphatase 63 38 - 126 U/L   Total Bilirubin 0.9 0.3 - 1.2 mg/dL   GFR calc non Af Amer 55 (L) >60 mL/min   GFR calc Af Amer >60 >60 mL/min    Comment: (NOTE) The eGFR has been calculated using the CKD EPI equation. This calculation has not been validated in all clinical situations. eGFR's persistently <60 mL/min signify possible Chronic Kidney Disease.    Anion gap 9 5 - 15  CBC     Status: None   Collection Time: 09/01/14  4:52 AM  Result Value Ref Range   WBC 7.4 3.8 - 10.6 K/uL   RBC 4.56 4.40 - 5.90 MIL/uL   Hemoglobin 14.5 13.0 - 18.0 g/dL   HCT 42.4 40.0 - 52.0 %   MCV 93.0 80.0 - 100.0 fL   MCH 31.8 26.0 - 34.0 pg   MCHC 34.2 32.0 - 36.0 g/dL   RDW 12.4 11.5 - 14.5 %   Platelets 225 150 - 440 K/uL    Ct Renal Stone Study  08/31/2014   CLINICAL DATA:  Vomiting and left flank pain for 1 week  EXAM: CT ABDOMEN AND PELVIS WITHOUT CONTRAST  TECHNIQUE: Multidetector CT imaging of the abdomen and pelvis was performed following the standard protocol without IV contrast.  COMPARISON:  None.  FINDINGS: Lung bases are free of acute infiltrate or sizable effusion.  The liver, gallbladder, spleen, adrenal glands and pancreas  are within normal limits. The right kidney is well visualized without renal calculi or obstructive changes. The right ureter is unremarkable. Left kidney demonstrates mild hydronephrosis extending to the level of the distal ureter. There is a 6 mm stone identified at the left UVJ causing the obstructive change. The bladder is decompressed. No pelvic mass  lesion or sidewall abnormality is seen. The appendix has been surgically removed. No acute bony abnormality is noted.  IMPRESSION: Left ureterovesical junction stone measuring 6 mm causing mild hydronephrosis.   Electronically Signed   By: Inez Catalina M.D.   On: 08/31/2014 20:23    Review of Systems  Constitutional: Negative.  Negative for fever, chills, weight loss and malaise/fatigue.  HENT: Negative.   Respiratory: Negative.   Cardiovascular: Negative.   Gastrointestinal: Positive for nausea and vomiting.  Genitourinary: Positive for urgency and flank pain.  Musculoskeletal: Negative.   Skin: Negative.   Neurological: Negative.   Endo/Heme/Allergies: Negative.   Psychiatric/Behavioral: Negative.    Blood pressure 121/74, pulse 60, temperature 97.5 F (36.4 C), temperature source Oral, resp. rate 18, height 6' 3"  (1.905 m), weight 112.9 kg (248 lb 14.4 oz), SpO2 100 %. Physical Exam  Constitutional: He appears well-developed.  Family at bedside  HENT:  Head: Normocephalic.  Eyes: Pupils are equal, round, and reactive to light.  Neck: Normal range of motion.  Cardiovascular: Normal rate.   Respiratory: Effort normal.  GI: Soft.  Genitourinary:  Moderate left CVAT  Musculoskeletal: Normal range of motion.  Neurological: He is alert.  Skin: Skin is warm and dry.  Psychiatric: He has a normal mood and affect. His behavior is normal. Judgment and thought content normal.    Assessment/Plan:  1 - Left Distal Ureteral Stone with Refractory Pain - discussed options of medical therapy, SWL, URS with the latter beign most definitive in acute setting and offering aprox 90% chance of success in single setting and approx 10% chance of need for staged procedure. Risks, benefits, alternatives reiterrated. He wants to proceed with left ureteroscopy / laser / stent today. Likely OK for DC home afterwards today or tomorrow per primary team.   2 - Acute Renal Failure - Likely from some  pre-renal dehydration from refracotry emesis as well as unilateral renal obstruction. This will likely quickly resolve with renal decompression.    Saquoia Sianez 09/01/2014, 10:35 AM

## 2014-09-01 NOTE — Plan of Care (Signed)
Problem: Discharge Progression Outcomes Goal: Discharge plan in place and appropriate Individualization of Care 1. Prefers to be called Sean Norton 2.  Hx of nephrolithiasis 6  3.  Low Fall precautions    Goal: Other Discharge Outcomes/Goals Plan of Care Progress to Goal:  Pt admitted for pain, pt with complete pain relief at this time, ivf per orders, will monitor.

## 2014-09-01 NOTE — Progress Notes (Signed)
spoek to pt about plan. He would likel to go home in evening after procedure.  Going for procedure later today.

## 2014-09-01 NOTE — Brief Op Note (Signed)
08/31/2014 - 09/01/2014  2:43 PM  PATIENT:  Sean Norton  54 y.o. male  PRE-OPERATIVE DIAGNOSIS:  nephrolithiasis left  POST-OPERATIVE DIAGNOSIS:  nephrolithiasis left  PROCEDURE:  Procedure(s): CYSTOSCOPY WITH STENT PLACEMENT (Left) URETEROSCOPY WITH HOLMIUM LASER LITHOTRIPSY (Left)  SURGEON:  Surgeon(s) and Role:    * Alexis Frock, MD - Primary  PHYSICIAN ASSISTANT:   ASSISTANTS: none   ANESTHESIA:   general  EBL:  Total I/O In: -  Out: 25 [Urine:25]  BLOOD ADMINISTERED:none  DRAINS: none   LOCAL MEDICATIONS USED:  NONE  SPECIMEN:  Source of Specimen:  left ureteral stone  DISPOSITION OF SPECIMEN:  lab for compositional analysis  COUNTS:  YES  TOURNIQUET:  * No tourniquets in log *  DICTATION: .Note written in EPIC  PLAN OF CARE: Admit for overnight observation  PATIENT DISPOSITION:  PACU - hemodynamically stable.   Delay start of Pharmacological VTE agent (>24hrs) due to surgical blood loss or risk of bleeding: yes

## 2014-09-01 NOTE — Discharge Summary (Signed)
Sean Norton    MR#:  [id number]  DATE OF BIRTH:  11-21-1960  DATE OF ADMISSION:  08/31/2014 ADMITTING PHYSICIAN: Lytle Butte, MD  DATE OF DISCHARGE: 09/01/2014   PRIMARY CARE PHYSICIAN: No primary care provider on file.    ADMISSION DIAGNOSIS:  Ureterolithiasis [N20.1] UTI (lower urinary tract infection) [N39.0] Left flank pain [R10.9]  DISCHARGE DIAGNOSIS:  Active Problems:   Intractable abdominal pain   Hydronephrosis of left kidney   Nephrolithiasis   SECONDARY DIAGNOSIS:   Past Medical History  Diagnosis Date  . Kidney stones     HOSPITAL COURSE:   1. Intractable pain secondary to left sided nephrolithiasis mild hydronephrosis: Case discussed with urology by ED staff, provide pain medication, Zofran, and about a coverage with ceftriaxone   Urologist seen the pt and taken for procedure to put stent to remove stone. 2. Acute kidney injury: IV fluid hydration- resolve after removing stone- follow with PMD. 3. Hypokalemia: Replaced goal 4-5 4. Venous thromboembolism prophylactic: SCD  DISCHARGE CONDITIONS:   Stable.  CONSULTS OBTAINED:  Treatment Team:  Lytle Butte, MD Festus Aloe, MD  DRUG ALLERGIES:  No Known Allergies  DISCHARGE MEDICATIONS:   Current Discharge Medication List    START taking these medications   Details  cephALEXin (KEFLEX) 500 MG capsule Take 1 capsule (500 mg total) by mouth 4 (four) times daily. Qty: 12 capsule, Refills: 0         DISCHARGE INSTRUCTIONS:    Follow with PMD in office in 1 week, to check renal function.  If you experience worsening of your admission symptoms, develop shortness of breath, life threatening emergency, suicidal or homicidal thoughts you must seek medical attention immediately by calling 911 or calling your MD immediately  if symptoms less severe.  You Must read complete instructions/literature along with all the  possible adverse reactions/side effects for all the Medicines you take and that have been prescribed to you. Take any new Medicines after you have completely understood and accept all the possible adverse reactions/side effects.   Please note  You were cared for by a hospitalist during your hospital stay. If you have any questions about your discharge medications or the care you received while you were in the hospital after you are discharged, you can call the unit and asked to speak with the hospitalist on call if the hospitalist that took care of you is not available. Once you are discharged, your primary care physician will handle any further medical issues. Please note that NO REFILLS for any discharge medications will be authorized once you are discharged, as it is imperative that you return to your primary care physician (or establish a relationship with a primary care physician if you do not have one) for your aftercare needs so that they can reassess your need for medications and monitor your lab values.    Today   CHIEF COMPLAINT:   Chief Complaint  Patient presents with  . Flank Pain    HISTORY OF PRESENT ILLNESS:  Sean Norton  is a 54 y.o. male with a known history of Sean Norton is a 54 y.o. male with a known history of nephrolithiasis 6 presented left-sided flank pain. Describes approximately 1 week duration left-sided flank pain, sharp/stabbing in quality, radiation down to the groin, no worsening or relieving factors, associated dysuria, intensity 10/10. Denies fevers or chills Emergency course: Found to have a 6 mm left sided  nephrolithiasis with associated mild hydronephrosis case discussed with urology on-call  VITAL SIGNS:  Blood pressure 121/74, pulse 60, temperature 97.5 F (36.4 C), temperature source Oral, resp. rate 18, height 6\' 3"  (1.905 m), weight 112.9 kg (248 lb 14.4 oz), SpO2 100 %.  I/O:   Intake/Output Summary (Last 24 hours) at 09/01/14 1352 Last data  filed at 09/01/14 0721  Gross per 24 hour  Intake    861 ml  Output     25 ml  Net    836 ml    PHYSICAL EXAMINATION:  GENERAL:53 y.o.male currently in mild/moderate distress given pain HEAD: Normocephalic, atraumatic.  EYES: Pupils equal, round, reactive to light. Extraocular muscles intact. No scleral icterus.  MOUTH: Moist mucosal membrane. Dentition intact. No abscess noted.  EAR, NOSE, THROAT: Clear without exudates. No external lesions.  NECK: Supple. No thyromegaly. No nodules. No JVD.  PULMONARY: Clear to ascultation, without wheeze rails or rhonci. No use of accessory muscles, Good respiratory effort. good air entry bilaterally CHEST: Nontender to palpation.  CARDIOVASCULAR: S1 and S2. Regular rate and rhythm. No murmurs, rubs, or gallops. No edema. Pedal pulses 2+ bilaterally.  GASTROINTESTINAL: Soft, nontender, nondistended. No masses. Positive bowel sounds. No hepatosplenomegaly. As a left CVA tenderness MUSCULOSKELETAL: No swelling, clubbing, or edema. Range of motion full in all extremities.  NEUROLOGIC: Cranial nerves II through XII are intact. No gross focal neurological deficits. Sensation intact. Reflexes intact.  SKIN: No ulceration, lesions, rashes, or cyanosis. Skin warm and dry. Turgor intact.  PSYCHIATRIC: Mood, affect within normal limits. The patient is awake, alert and oriented x 3. Insight, judgment intact.    Seen before procedure.  DATA REVIEW:   CBC  Recent Labs Lab 09/01/14 0452  WBC 7.4  HGB 14.5  HCT 42.4  PLT 225    Chemistries   Recent Labs Lab 09/01/14 0452  NA 142  K 4.3  CL 106  CO2 27  GLUCOSE 112*  BUN 26*  CREATININE 1.43*  CALCIUM 8.8*  AST 24  ALT 31  ALKPHOS 63  BILITOT 0.9    Cardiac Enzymes No results for input(s): TROPONINI in the last 168 hours.  Microbiology Results  No results found for this or any previous visit.  RADIOLOGY:  Ct Renal Stone Study  08/31/2014   CLINICAL DATA:  Vomiting and left  flank pain for 1 week  EXAM: CT ABDOMEN AND PELVIS WITHOUT CONTRAST  TECHNIQUE: Multidetector CT imaging of the abdomen and pelvis was performed following the standard protocol without IV contrast.  COMPARISON:  None.  FINDINGS: Lung bases are free of acute infiltrate or sizable effusion.  The liver, gallbladder, spleen, adrenal glands and pancreas are within normal limits. The right kidney is well visualized without renal calculi or obstructive changes. The right ureter is unremarkable. Left kidney demonstrates mild hydronephrosis extending to the level of the distal ureter. There is a 6 mm stone identified at the left UVJ causing the obstructive change. The bladder is decompressed. No pelvic mass lesion or sidewall abnormality is seen. The appendix has been surgically removed. No acute bony abnormality is noted.  IMPRESSION: Left ureterovesical junction stone measuring 6 mm causing mild hydronephrosis.   Electronically Signed   By: Inez Catalina M.D.   On: 08/31/2014 20:23       Management plans discussed with the patient, family and they are in agreement.  CODE STATUS:     Code Status Orders        Start  Ordered   08/31/14 2133  Full code   Continuous     08/31/14 2133      TOTAL TIME TAKING CARE OF THIS PATIENT: 35 minutes.    Vaughan Basta M.D on 09/01/2014 at 1:52 PM  Between 7am to 6pm - Pager - [phone]  After 6pm go to [email or web address] - password [email or web address] [facility]  [facility]  Office  [phone]  CC: Primary care physician; No primary care provider on file.

## 2014-09-01 NOTE — Anesthesia Postprocedure Evaluation (Signed)
  Anesthesia Post-op Note  Patient: Sean Norton  Procedure(s) Performed: Procedure(s): CYSTOSCOPY WITH STENT PLACEMENT (Left) URETEROSCOPY WITH HOLMIUM LASER LITHOTRIPSY (Left)  Anesthesia type:General  Patient location: PACU  Post pain: Pain level controlled  Post assessment: Post-op Vital signs reviewed, Patient's Cardiovascular Status Stable, Respiratory Function Stable, Patent Airway and No signs of Nausea or vomiting  Post vital signs: Reviewed and stable  Last Vitals:  Filed Vitals:   09/01/14 1525  BP:   Pulse: 61  Temp:   Resp: 11    Level of consciousness: awake, alert  and patient cooperative  Complications: No apparent anesthesia complications

## 2014-09-01 NOTE — Care Management (Signed)
Admitted to [facility] with the diagnosis of intractable abdominal pain. lives with wife, Levada Dy [phone]).  Works for [employer] in [place]. States he ha insurance, but lost card. Takes care of all activities of daily living himself and still drives. States he has no personal care physician. Discussed the fact that Dr, Jeananne Rama in [place] is taking new patients. NPO. Flank pain, possible stones. Urology consult.  Shelbie Ammons RN MSN Care Management [phone]

## 2014-09-01 NOTE — Anesthesia Postprocedure Evaluation (Signed)
  Anesthesia Post-op Note  Patient: Sean Norton  Procedure(s) Performed: Procedure(s): CYSTOSCOPY WITH STENT PLACEMENT (Left) URETEROSCOPY WITH HOLMIUM LASER LITHOTRIPSY (Left)  Anesthesia type:General  Patient location: PACU  Post pain: Pain level controlled  Post assessment: Post-op Vital signs reviewed, Patient's Cardiovascular Status Stable, Respiratory Function Stable, Patent Airway and No signs of Nausea or vomiting  Post vital signs: Reviewed and stable  Last Vitals:  Filed Vitals:   09/01/14 1520  BP: 120/74  Pulse: 64  Temp:   Resp: 13    Level of consciousness: awake, alert  and patient cooperative  Complications: No apparent anesthesia complications

## 2014-09-01 NOTE — Plan of Care (Signed)
Problem: Discharge Progression Outcomes Goal: Other Discharge Outcomes/Goals 1. Left abdominal pain wrapping around back temporarily relieved by PRN Dilaudid.  2. Hemodynamically: VSS, afebrile, IVF infusing  3. Pt off unit for cystoscopy with Dr. Tresa Moore with hopeful d/c late this evening  4. NPO since midnight  5. Independent in room. Understand how to use call system for assistance.

## 2014-09-01 NOTE — Discharge Instructions (Signed)
1 - You may have urinary urgency (bladder spasms) and bloody urine on / off with stent in place. This is normal.  2 - Call MD or go to ER for fever >102, severe pain / nausea / vomiting not relieved by medications, or acute change in medical status  3 - Remove tethered stent on Monday morning at home by pulling on string, then blue/white plastic tubing and discarding.  Lithotripsy for Kidney Stones Lithotripsy is a treatment that can sometimes help eliminate kidney stones and pain that they cause. A form of lithotripsy, also known as extracorporeal shock wave lithotripsy, is a nonsurgical procedure that helps your body rid itself of the kidney stone when it is too big to pass on its own. Extracorporeal shock wave lithotripsy is a method of crushing a kidney stone with shock waves. These shock waves pass through your body and are focused on your stone. They cause the kidney stones to crumble while still in the urinary tract. It is then easier for the smaller pieces of stone to pass in the urine. Lithotripsy usually takes about an hour. It is done in a hospital, a lithotripsy center, or a mobile unit. It usually does not require an overnight stay. Your health care provider will instruct you on preparation for the procedure. Your health care provider will tell you what to expect afterward. LET [facility] CARE PROVIDER KNOW ABOUT:  Any allergies you have.  All medicines you are taking, including vitamins, herbs, eye drops, creams, and over-the-counter medicines.  Previous problems you or members of your family have had with the use of anesthetics.  Any blood disorders you have.  Previous surgeries you have had.  Medical conditions you have. RISKS AND COMPLICATIONS Generally, lithotripsy for kidney stones is a safe procedure. However, as with any procedure, complications can occur. Possible complications include:  Infection.  Bleeding of the kidney.  Bruising of the kidney or  skin.  Obstruction of the ureter.  Failure of the stone to fragment. BEFORE THE PROCEDURE  Do not eat or drink for 6-8 hours prior to the procedure. You may, however, take the medications with a sip of water that your physician instructs you to take  Do not take aspirin or aspirin-containing products for 7 days prior to your procedure  Do not take nonsteroidal anti-inflammatory products for 7 days prior to your procedure PROCEDURE A stent (flexible tube with holes) may be placed in your ureter. The ureter is the tube that transports the urine from the kidneys to the bladder. Your health care provider may place a stent before the procedure. This will help keep urine flowing from the kidney if the fragments of the stone block the ureter. You may have an IV tube placed in one of your veins to give you fluids and medicines. These medicines may help you relax or make you sleep. During the procedure, you will lie comfortably on a fluid-filled cushion or in a warm-water bath. After an X-ray or ultrasound exam to locate your stone, shock waves are aimed at the stone. If you are awake, you may feel a tapping sensation as the shock waves pass through your body. If large stone particles remain after treatment, a second procedure may be necessary at a later date. For comfort during the test:  Relax as much as possible.  Try to remain still as much as possible.  Try to follow instructions to speed up the test.  Let your health care provider know if you are uncomfortable,  anxious, or in pain. AFTER THE PROCEDURE  After surgery, you will be taken to the recovery area. A nurse will watch and check your progress. Once you're awake, stable, and taking fluids well, you will be allowed to go home as long as there are no problems. You will also be allowed to pass your urine before discharge.You may be given antibiotics to help prevent infection. You may also be prescribed pain medicine if needed. In a week or  two, your health care provider may remove your stent, if you have one. You may first have an X-ray exam to check on how successful the fragmentation of your stone has been and how much of the stone has passed. Your health care provider will check to see whether or not stone particles remain. SEEK IMMEDIATE MEDICAL CARE IF:  You develop a fever or shaking chills.  Your pain is not relieved by medicine.  You feel sick to your stomach (nauseated) and you vomit.  You develop heavy bleeding.  You have difficulty urinating.  You start to pass your stent from your penis. Document Released: 02/02/2000 Document Revised: 11/25/2012 Document Reviewed: 08/20/2012 [facility] Patient Information 2015 [place], [place]. This information is not intended to replace advice given to you by your health care provider. Make sure you discuss any questions you have with your health care provider.  Kidney Stones Kidney stones (urolithiasis) are deposits that form inside your kidneys. The intense pain is caused by the stone moving through the urinary tract. When the stone moves, the ureter goes into spasm around the stone. The stone is usually passed in the urine.  CAUSES   A disorder that makes certain neck glands produce too much parathyroid hormone (primary hyperparathyroidism).  A buildup of uric acid crystals, similar to gout in your joints.  Narrowing (stricture) of the ureter.  A kidney obstruction present at birth (congenital obstruction).  Previous surgery on the kidney or ureters.  Numerous kidney infections. SYMPTOMS   Feeling sick to your stomach (nauseous).  Throwing up (vomiting).  Blood in the urine (hematuria).  Pain that usually spreads (radiates) to the groin.  Frequency or urgency of urination. DIAGNOSIS   Taking a history and physical exam.  Blood or urine tests.  CT scan.  Occasionally, an examination of the inside of the urinary bladder (cystoscopy) is performed. TREATMENT    Observation.  Increasing your fluid intake.  Extracorporeal shock wave lithotripsy--This is a noninvasive procedure that uses shock waves to break up kidney stones.  Surgery may be needed if you have severe pain or persistent obstruction. There are various surgical procedures. Most of the procedures are performed with the use of small instruments. Only small incisions are needed to accommodate these instruments, so recovery time is minimized. The size, location, and chemical composition are all important variables that will determine the proper choice of action for you. Talk to your health care provider to better understand your situation so that you will minimize the risk of injury to yourself and your kidney.  HOME CARE INSTRUCTIONS   Drink enough water and fluids to keep your urine clear or pale yellow. This will help you to pass the stone or stone fragments.  Strain all urine through the provided strainer. Keep all particulate matter and stones for your health care provider to see. The stone causing the pain may be as small as a grain of salt. It is very important to use the strainer each and every time you pass your urine. The collection  of your stone will allow your health care provider to analyze it and verify that a stone has actually passed. The stone analysis will often identify what you can do to reduce the incidence of recurrences.  Only take over-the-counter or prescription medicines for pain, discomfort, or fever as directed by your health care provider.  Make a follow-up appointment with your health care provider as directed.  Get follow-up X-rays if required. The absence of pain does not always mean that the stone has passed. It may have only stopped moving. If the urine remains completely obstructed, it can cause loss of kidney function or even complete destruction of the kidney. It is your responsibility to make sure X-rays and follow-ups are completed. Ultrasounds of the  kidney can show blockages and the status of the kidney. Ultrasounds are not associated with any radiation and can be performed easily in a matter of minutes. SEEK MEDICAL CARE IF:  You experience pain that is progressive and unresponsive to any pain medicine you have been prescribed. SEEK IMMEDIATE MEDICAL CARE IF:   Pain cannot be controlled with the prescribed medicine.  You have a fever or shaking chills.  The severity or intensity of pain increases over 18 hours and is not relieved by pain medicine.  You develop a new onset of abdominal pain.  You feel faint or pass out.  You are unable to urinate. MAKE SURE YOU:   Understand these instructions.  Will watch your condition.  Will get help right away if you are not doing well or get worse. Document Released: 02/04/2005 Document Revised: 10/07/2012 Document Reviewed: 07/08/2012 [facility] Patient Information 2015 [place], [place]. This information is not intended to replace advice given to you by your health care provider. Make sure you discuss any questions you have with your health care provider.

## 2014-09-01 NOTE — Progress Notes (Signed)
Discharge paperwork given to patient. Dr. Tresa Moore and Dr. Anselm Jungling called due to pt unhappy with no MD note of when to return to work/work restrictions which is necessary to keep job. Both MD stated they could not write note for pt.

## 2014-09-01 NOTE — Progress Notes (Signed)
Pt voided orange colored urine in urinal, urine strained to see if there were any stones, noted sediment in filter and a couple of clots, left for MD to see in filter.  Donnita Falls, RN

## 2014-09-02 ENCOUNTER — Encounter: Payer: Self-pay | Admitting: Urology

## 2014-09-02 NOTE — Progress Notes (Signed)
[place], [place].   09/02/2014  Patient: Sean Norton   Date of Birth:  December 20, 1960  Date of admission:  08/31/2014  Date of Discharge  09/01/2014    To Whom it May Concern:   Reno Clasby  may return to work on 09/02/14.  PHYSICAL ACTIVITY:  Full  If you have any questions or concerns, please don't hesitate to call.  Sincerely,   Vaughan Basta M.D Pager [phone] Office : [phone]   .

## 2014-09-05 LAB — STONE ANALYSIS
CA PHOS CRY STONE QL IR: 15 %
Ca Oxalate,Dihydrate: 65 %
Ca Oxalate,Monohydr.: 20 %
STONE WEIGHT KSTONE: 18 mg

## 2014-10-20 ENCOUNTER — Telehealth: Payer: Self-pay

## 2014-10-20 DIAGNOSIS — N200 Calculus of kidney: Secondary | ICD-10-CM

## 2014-10-20 NOTE — Telephone Encounter (Signed)
-----   Message from Benard Halsted sent at 10/20/2014  8:51 AM EDT ----- Regarding: FW: Nonurgent follow up Can you make sure this pt has an order for this please  Thanks, Sharyn Lull ----- Message -----    From: Hollice Espy, MD    Sent: 09/30/2014   3:16 PM      To: Benard Halsted Subject: RE: Nonurgent follow up                        I have no idea what that means.  Please schedule follow up.  He needs an RUS prior.  Hollice Espy, MD  ----- Message -----    From: Benard Halsted    Sent: 09/21/2014  11:58 AM      To: Hollice Espy, MD Subject: RE: Nonurgent follow up                        Was this put in the work queue? ----- Message -----    From: Hollice Espy, MD    Sent: 09/02/2014   7:48 AM      To: Alexis Frock, MD, Gerhard Perches Subject: Nonurgent follow up                            Please schedule this patient for followup in 4 weeks with RUS prior.    Thanks,  Hollice Espy   ----- Message -----    From: Alexis Frock, MD    Sent: 09/01/2014   2:58 PM      To: Hollice Espy, MD Subject: non-urgent follow up                           This guy needs non-urgent f/u for left ureteral stone. He was treated and sent home 7/14, he is pulling stent at home in few days.  Sorry to bother, I need to get that front desk pool Id again so I can just send to them.  Clare Gandy

## 2014-10-20 NOTE — Telephone Encounter (Signed)
Orders have been placed.

## 2016-02-23 IMAGING — CT CT RENAL STONE PROTOCOL
1 of 2 series · 15 of 32 positions shown, 19 images · non-contrast
Comparison: None.

CLINICAL DATA: Vomiting and left flank pain for 1 week

EXAM:
CT ABDOMEN AND PELVIS WITHOUT CONTRAST
TECHNIQUE: Multidetector CT imaging of the abdomen and pelvis was performed
following the standard protocol without IV contrast.

[Series 2: stone standard full · axial · 0.79mm/px · z∈[-1316,-852]mm · 15 of 103 slices shown, 19 images]
[im 5/103  soft-tissue]
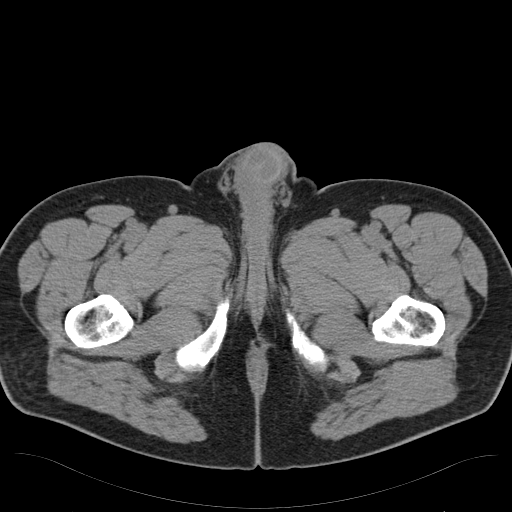
[im 5/103  bone]
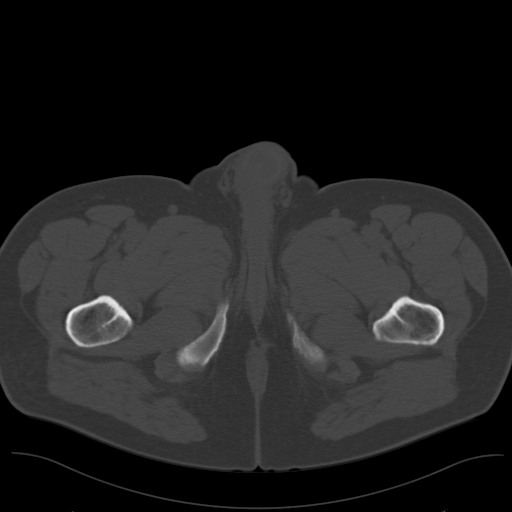
[im 13/103  soft-tissue]
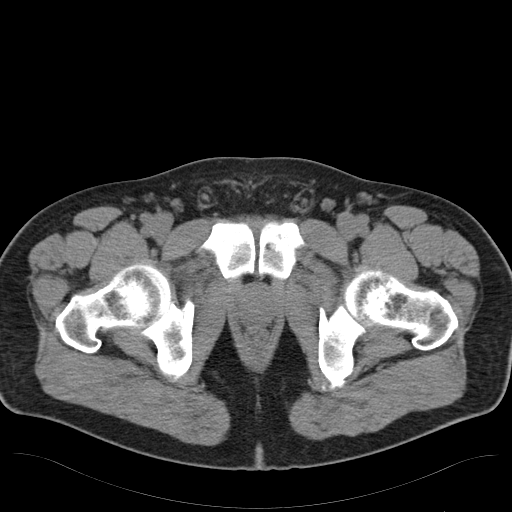
[im 21/103  soft-tissue]
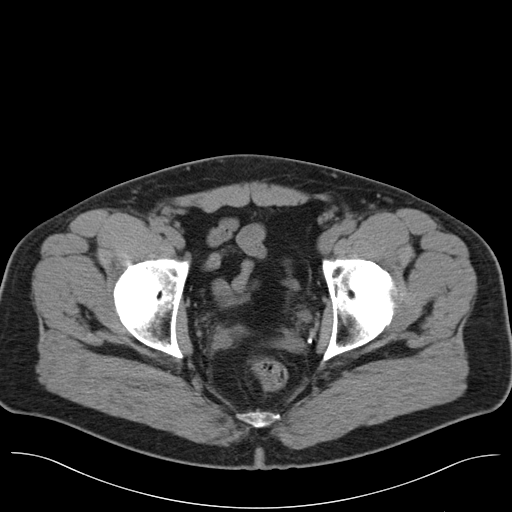
[im 29/103  soft-tissue]
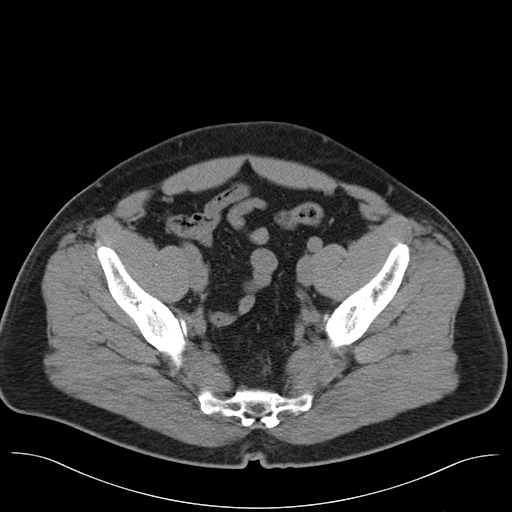
[im 37/103  soft-tissue]
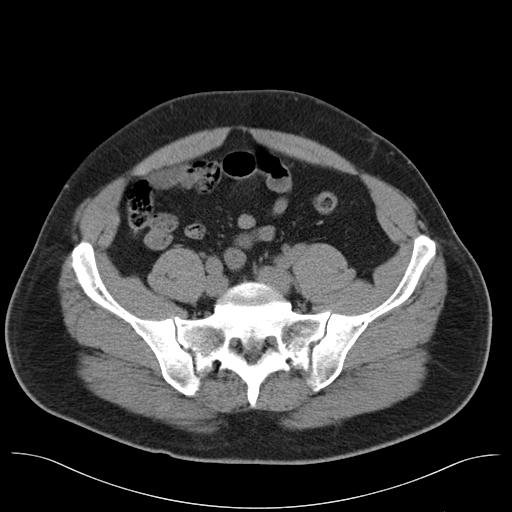
[im 45/103  soft-tissue]
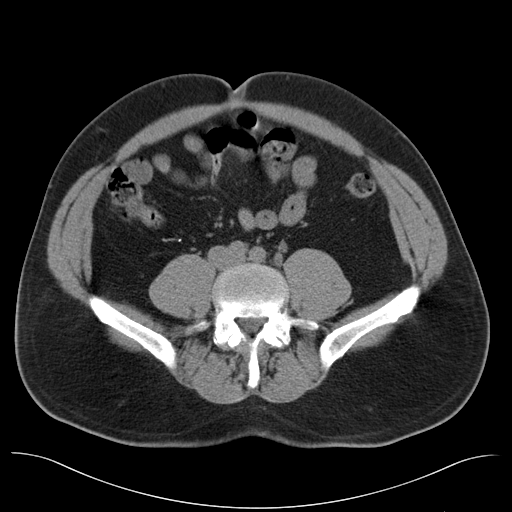
[im 54/103  soft-tissue]
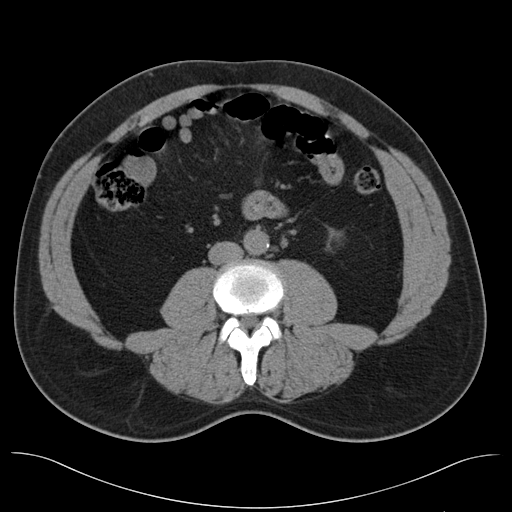
[im 58/103  soft-tissue]
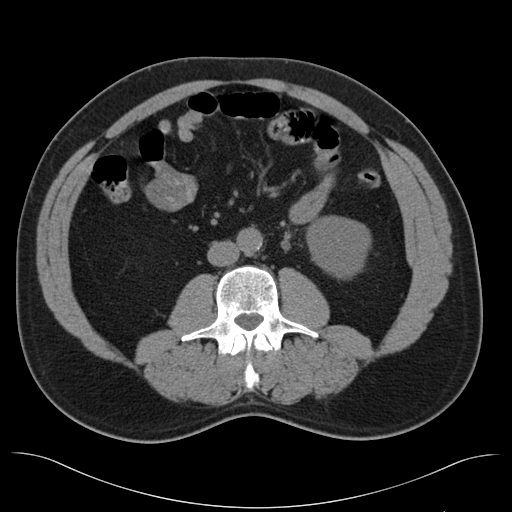
[im 66/103  soft-tissue]
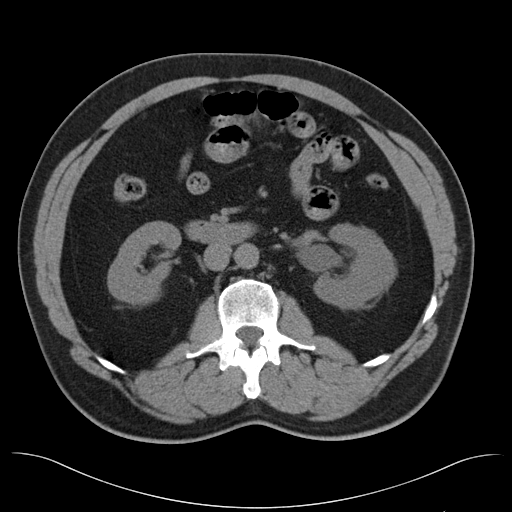
[im 66/103  bone]
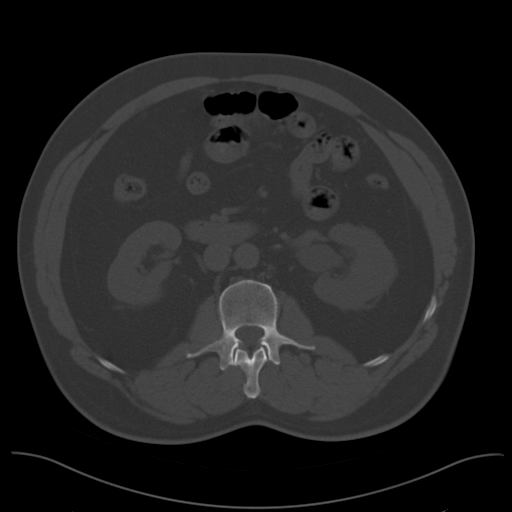
[im 74/103  soft-tissue]
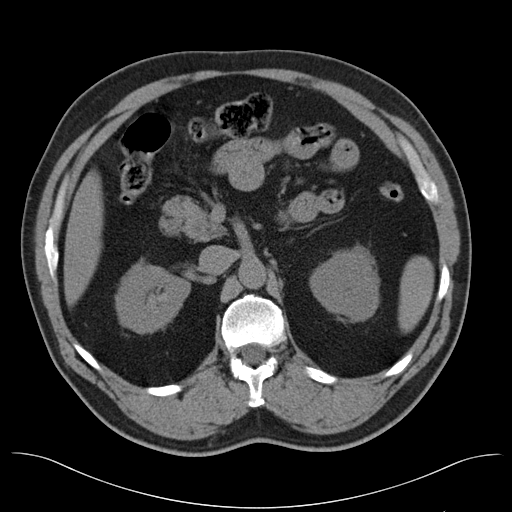
[im 82/103  soft-tissue]
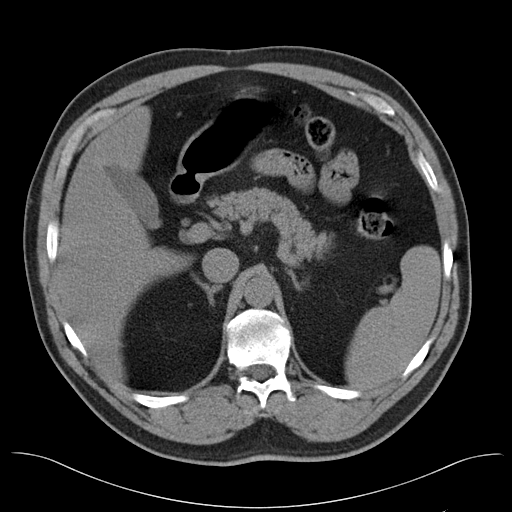
[im 86/103  lung]
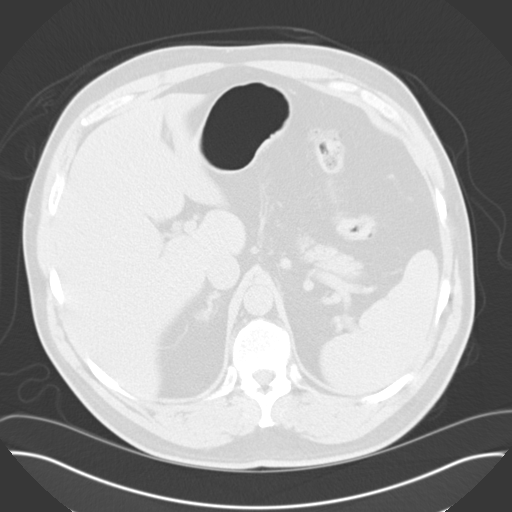
[im 90/103  soft-tissue]
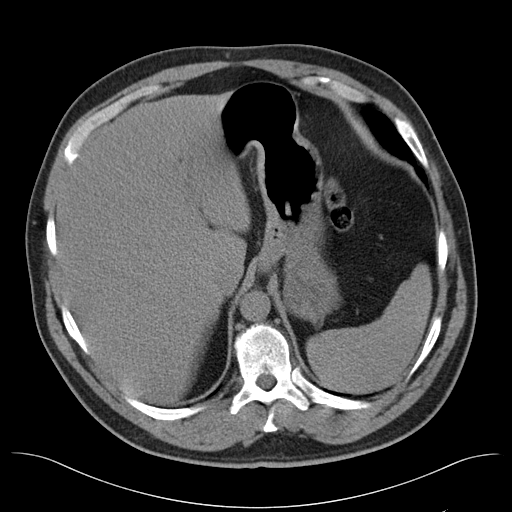
[im 90/103  lung]
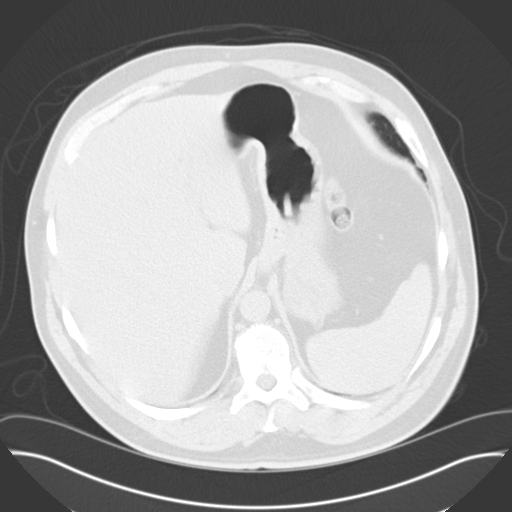
[im 94/103  lung]
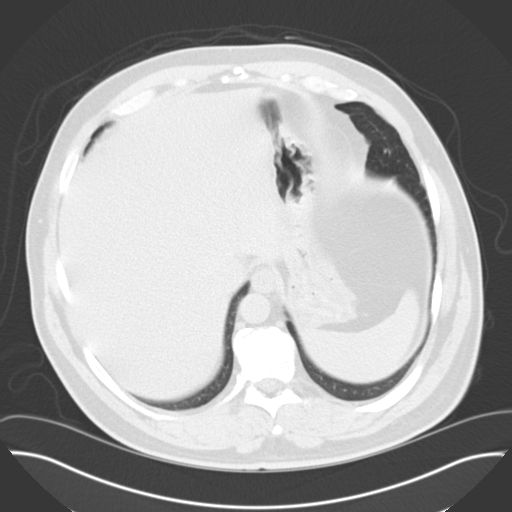
[im 98/103  soft-tissue]
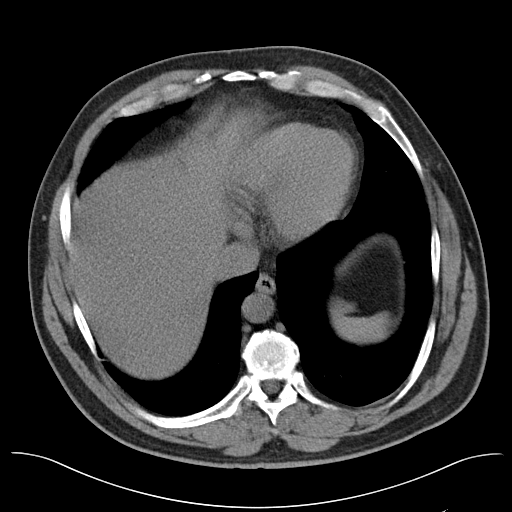
[im 98/103  lung]
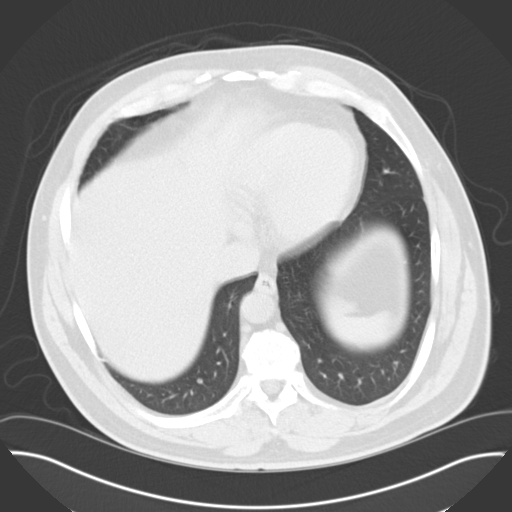

[15 of 32 positions shown; findings below may reference images not displayed]

FINDINGS: Lung bases are free of acute infiltrate or sizable effusion.

The liver, gallbladder, spleen, adrenal glands and pancreas are
within normal limits. The right kidney is well visualized without
renal calculi or obstructive changes. The right ureter is
unremarkable. Left kidney demonstrates mild hydronephrosis extending
to the level of the distal ureter. There is a 6 mm stone identified
at the left UVJ causing the obstructive change. The bladder is
decompressed. No pelvic mass lesion or sidewall abnormality is seen.
The appendix has been surgically removed. No acute bony abnormality
is noted.
IMPRESSION: Left ureterovesical junction stone measuring 6 mm causing mild
hydronephrosis.

## 2018-02-01 ENCOUNTER — Emergency Department
Admission: EM | Admit: 2018-02-01 | Discharge: 2018-02-01 | Disposition: A | Discharge status: Home / Self Care | Payer: Self-pay | Attending: Emergency Medicine | Admitting: Emergency Medicine

## 2018-02-01 ENCOUNTER — Encounter: Payer: Self-pay | Admitting: Emergency Medicine

## 2018-02-01 ENCOUNTER — Other Ambulatory Visit: Payer: Self-pay

## 2018-02-01 DIAGNOSIS — T2220XA Burn of second degree of shoulder and upper limb, except wrist and hand, unspecified site, initial encounter: Secondary | ICD-10-CM

## 2018-02-01 DIAGNOSIS — Y92008 Other place in unspecified non-institutional (private) residence as the place of occurrence of the external cause: Secondary | ICD-10-CM | POA: Insufficient documentation

## 2018-02-01 DIAGNOSIS — T310 Burns involving less than 10% of body surface: Secondary | ICD-10-CM | POA: Insufficient documentation

## 2018-02-01 DIAGNOSIS — T22212A Burn of second degree of left forearm, initial encounter: Secondary | ICD-10-CM | POA: Insufficient documentation

## 2018-02-01 DIAGNOSIS — Y999 Unspecified external cause status: Secondary | ICD-10-CM | POA: Insufficient documentation

## 2018-02-01 DIAGNOSIS — Y9389 Activity, other specified: Secondary | ICD-10-CM | POA: Insufficient documentation

## 2018-02-01 DIAGNOSIS — Z23 Encounter for immunization: Secondary | ICD-10-CM | POA: Insufficient documentation

## 2018-02-01 DIAGNOSIS — X088XXA Exposure to other specified smoke, fire and flames, initial encounter: Secondary | ICD-10-CM | POA: Insufficient documentation

## 2018-02-01 MED ORDER — SILVER SULFADIAZINE 1 % EX CREA
TOPICAL_CREAM | CUTANEOUS | Status: AC
  Filled 2018-02-01: qty 85

## 2018-02-01 MED ORDER — ACETAMINOPHEN 500 MG PO TABS
1000.0000 mg | ORAL_TABLET | Freq: Once | ORAL | Status: AC
  Administered 2018-02-01: 1000 mg via ORAL
  Filled 2018-02-01: qty 2

## 2018-02-01 MED ORDER — TETANUS-DIPHTH-ACELL PERTUSSIS 5-2.5-18.5 LF-MCG/0.5 IM SUSP
0.5000 mL | Freq: Once | INTRAMUSCULAR | Status: AC
  Administered 2018-02-01: 0.5 mL via INTRAMUSCULAR
  Filled 2018-02-01: qty 0.5

## 2018-02-01 MED ORDER — SILVER SULFADIAZINE 1 % EX CREA
TOPICAL_CREAM | Freq: Once | CUTANEOUS | Status: AC
  Administered 2018-02-01: 1 via TOPICAL
  Filled 2018-02-01: qty 85
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
## 2019-01-05 ENCOUNTER — Other Ambulatory Visit: Payer: Self-pay

## 2019-01-05 DIAGNOSIS — Z20822 Contact with and (suspected) exposure to covid-19: Secondary | ICD-10-CM

## 2019-01-06 LAB — NOVEL CORONAVIRUS, NAA: SARS-CoV-2, NAA: NOT DETECTED

## 2019-01-07 ENCOUNTER — Telehealth: Payer: Self-pay | Admitting: General Practice

## 2019-01-07 NOTE — Telephone Encounter (Signed)
Gave patient negative covid test results Patient understood 

## 2019-01-07 NOTE — Telephone Encounter (Signed)
° °  Pt rec neg COVID results °

## 2019-01-09 ENCOUNTER — Telehealth: Payer: Self-pay

## 2019-01-09 NOTE — Telephone Encounter (Signed)
Per pt request wrote letter to manager Dawn that test is negative for Covid 19.

## 2019-01-18 ENCOUNTER — Other Ambulatory Visit: Payer: Self-pay

## 2019-01-18 DIAGNOSIS — Z20822 Contact with and (suspected) exposure to covid-19: Secondary | ICD-10-CM

## 2019-01-19 LAB — NOVEL CORONAVIRUS, NAA: SARS-CoV-2, NAA: DETECTED — AB

## 2019-01-22 ENCOUNTER — Encounter: Payer: Self-pay | Admitting: *Deleted

## 2019-01-25 ENCOUNTER — Telehealth: Payer: Self-pay | Admitting: *Deleted

## 2019-01-25 NOTE — Telephone Encounter (Signed)
He called in requesting his COVID-19 test result.    We have been trying to contact him on 3 different occasions. I let him know his COVID-19 test result was detected/positive meaning he has the virus. He is not having any symptoms and was shocked that his test was positive.   I went over the 10 day protocol to quarantine.  He can end quarantine 12/28/2018 provided he does not develop symptoms or fever.  I went over the cleaning, wearing masks, washing hands, etc.   He verbalized understanding. He requested help with getting into his MyChart.  Everything looked correct from this end as far as e mail and login code.   I gave him the number to the MyChart help desk so they could further assist him.  He thanked me for my help.

## 2021-09-10 ENCOUNTER — Other Ambulatory Visit: Payer: Self-pay

## 2021-09-10 ENCOUNTER — Encounter: Payer: Self-pay | Admitting: Nurse Practitioner

## 2021-09-10 ENCOUNTER — Ambulatory Visit (INDEPENDENT_AMBULATORY_CARE_PROVIDER_SITE_OTHER): Payer: Self-pay | Admitting: Nurse Practitioner

## 2021-09-10 VITALS — BP 124/76 | HR 84 | Temp 98.0°F | Resp 18 | Ht 75.0 in | Wt 268.6 lb

## 2021-09-10 DIAGNOSIS — Z130 Encounter for screening for diseases of the blood and blood-forming organs and certain disorders involving the immune mechanism: Secondary | ICD-10-CM

## 2021-09-10 DIAGNOSIS — Z114 Encounter for screening for human immunodeficiency virus [HIV]: Secondary | ICD-10-CM

## 2021-09-10 DIAGNOSIS — E669 Obesity, unspecified: Secondary | ICD-10-CM

## 2021-09-10 DIAGNOSIS — Z7689 Persons encountering health services in other specified circumstances: Secondary | ICD-10-CM

## 2021-09-10 DIAGNOSIS — Z1211 Encounter for screening for malignant neoplasm of colon: Secondary | ICD-10-CM

## 2021-09-10 DIAGNOSIS — Z13220 Encounter for screening for lipoid disorders: Secondary | ICD-10-CM

## 2021-09-10 DIAGNOSIS — Z1159 Encounter for screening for other viral diseases: Secondary | ICD-10-CM

## 2021-09-10 DIAGNOSIS — Z131 Encounter for screening for diabetes mellitus: Secondary | ICD-10-CM

## 2021-09-10 NOTE — Progress Notes (Signed)
besi

## 2021-09-10 NOTE — Progress Notes (Signed)
BP 124/76   Pulse 84   Temp 98 F (36.7 C) (Oral)   Resp 18   Ht '6\' 3"'$  (1.905 m)   Wt 268 lb 9.6 oz (121.8 kg)   SpO2 97%   BMI 33.57 kg/m    Subjective:    Patient ID: Sean Norton, male    DOB: 1960-07-18, 61 y.o.   MRN: [id number]  HPI: Sean Norton is a 61 y.o. male, here alone  Chief Complaint  Patient presents with   Establish Care   Obesity    Discuss weight loss   Establish care: Says his last physical was many years ago.  He reports his only medical history includes kidney stones.  He is not currently taking any medications. He is due for his colonoscopy and does have a family history of colon cancer.   Obesity: Patient's current weight is 268 pounds with a BMI of 33.57.  Patient states his highest weight was 268 lbs.  He reports he has tried working out, he says he is only eating one meal day.  Discussed different weight loss medications with patient.  Also discussed weight and wellness program with [facility] health. Patient would like to try that information provided.     09/10/2021    8:19 AM  Depression screen PHQ 2/9  Decreased Interest 0  Down, Depressed, Hopeless 0  PHQ - 2 Score 0    Relevant past medical, surgical, family and social history reviewed and updated as indicated. Interim medical history since our last visit reviewed. Allergies and medications reviewed and updated.  Review of Systems  Constitutional: Negative for fever or weight change.  Respiratory: Negative for cough and shortness of breath.   Cardiovascular: Negative for chest pain or palpitations.  Gastrointestinal: Negative for abdominal pain, no bowel changes.  Musculoskeletal: Negative for gait problem or joint swelling.  Skin: Negative for rash.  Neurological: Negative for dizziness or headache.  No other specific complaints in a complete review of systems (except as listed in HPI above).      Objective:    BP 124/76   Pulse 84   Temp 98 F (36.7 C) (Oral)   Resp 18   Ht '6\' 3"'$   (1.905 m)   Wt 268 lb 9.6 oz (121.8 kg)   SpO2 97%   BMI 33.57 kg/m   Wt Readings from Last 3 Encounters:  09/10/21 268 lb 9.6 oz (121.8 kg)  02/01/18 265 lb (120.2 kg)  08/31/14 248 lb 14.4 oz (112.9 kg)    Physical Exam  Constitutional: Patient appears well-developed and well-nourished. Obese  No distress.  HEENT: head atraumatic, normocephalic, pupils equal and reactive to light,  neck supple Cardiovascular: Normal rate, regular rhythm and normal heart sounds.  No murmur heard. No BLE edema. Pulmonary/Chest: Effort normal and breath sounds normal. No respiratory distress. Abdominal: Soft.  There is no tenderness. Psychiatric: Patient has a normal mood and affect. behavior is normal. Judgment and thought content normal.  Results for orders placed or performed in visit on 01/18/19  Novel Coronavirus, NAA ([facility])   Specimen: Oropharyngeal(OP) collection in vial transport medium   OROPHARYNGEA  TESTING  Result Value Ref Range   SARS-CoV-2, NAA Detected (A) Not Detected      Assessment & Plan:   Problem List Items Addressed This Visit   None Visit Diagnoses     Obesity (BMI 30-39.9)    -  Primary   Information for weight loss program with [facility] provided to patient.  We will be getting labs.   Relevant Orders   Lipid panel   CBC with Differential/Platelet   COMPLETE METABOLIC PANEL WITH GFR   Hemoglobin A1c   TSH   Screening for colon cancer       Relevant Orders   Ambulatory referral to Gastroenterology   Encounter to establish care       Patient needs to schedule CPE   Relevant Orders   Ambulatory referral to Gastroenterology   Lipid panel   CBC with Differential/Platelet   COMPLETE METABOLIC PANEL WITH GFR   Hemoglobin A1c   Hepatitis C antibody   HIV Antibody (routine testing w rflx)   TSH   Screening for diabetes mellitus       Relevant Orders   COMPLETE METABOLIC PANEL WITH GFR   Hemoglobin A1c   Screening for cholesterol level       Relevant  Orders   Lipid panel   Screening for HIV without presence of risk factors       Relevant Orders   HIV Antibody (routine testing w rflx)   Encounter for hepatitis C screening test for low risk patient       Relevant Orders   Hepatitis C antibody   Screening for deficiency anemia       Relevant Orders   CBC with Differential/Platelet        Follow up plan: Return in about 6 months (around 03/13/2022) for cpe.

## 2021-09-11 ENCOUNTER — Telehealth: Payer: Self-pay

## 2021-09-11 ENCOUNTER — Other Ambulatory Visit: Payer: Self-pay

## 2021-09-11 DIAGNOSIS — Z1211 Encounter for screening for malignant neoplasm of colon: Secondary | ICD-10-CM

## 2021-09-11 LAB — CBC WITH DIFFERENTIAL/PLATELET
Absolute Monocytes: 380 cells/uL (ref 200–950)
Basophils Absolute: 70 cells/uL (ref 0–200)
Basophils Relative: 1.4 %
Eosinophils Absolute: 230 cells/uL (ref 15–500)
Eosinophils Relative: 4.6 %
HCT: 45 % (ref 38.5–50.0)
Hemoglobin: 15.6 g/dL (ref 13.2–17.1)
Lymphs Abs: 1650 cells/uL (ref 850–3900)
MCH: 32.6 pg (ref 27.0–33.0)
MCHC: 34.7 g/dL (ref 32.0–36.0)
MCV: 93.9 fL (ref 80.0–100.0)
MPV: 10 fL (ref 7.5–12.5)
Monocytes Relative: 7.6 %
Neutro Abs: 2670 cells/uL (ref 1500–7800)
Neutrophils Relative %: 53.4 %
Platelets: 254 10*3/uL (ref 140–400)
RBC: 4.79 10*6/uL (ref 4.20–5.80)
RDW: 12.5 % (ref 11.0–15.0)
Total Lymphocyte: 33 %
WBC: 5 10*3/uL (ref 3.8–10.8)

## 2021-09-11 LAB — COMPLETE METABOLIC PANEL WITH GFR
AG Ratio: 1.6 (calc) (ref 1.0–2.5)
ALT: 25 U/L (ref 9–46)
AST: 14 U/L (ref 10–35)
Albumin: 4.2 g/dL (ref 3.6–5.1)
Alkaline phosphatase (APISO): 67 U/L (ref 35–144)
BUN: 18 mg/dL (ref 7–25)
CO2: 21 mmol/L (ref 20–32)
Calcium: 9 mg/dL (ref 8.6–10.3)
Chloride: 106 mmol/L (ref 98–110)
Creat: 0.92 mg/dL (ref 0.70–1.30)
Globulin: 2.7 g/dL (calc) (ref 1.9–3.7)
Glucose, Bld: 96 mg/dL (ref 65–99)
Potassium: 4.1 mmol/L (ref 3.5–5.3)
Sodium: 139 mmol/L (ref 135–146)
Total Bilirubin: 0.4 mg/dL (ref 0.2–1.2)
Total Protein: 6.9 g/dL (ref 6.1–8.1)
eGFR: 96 mL/min/{1.73_m2} (ref >=60)

## 2021-09-11 LAB — HEMOGLOBIN A1C
Hgb A1c MFr Bld: 5.4 % of total Hgb (ref <5.7)
Mean Plasma Glucose: 108 mg/dL
eAG (mmol/L): 6 mmol/L

## 2021-09-11 LAB — LIPID PANEL
Cholesterol: 237 mg/dL — ABNORMAL HIGH (ref <200)
HDL: 47 mg/dL (ref >=40)
LDL Cholesterol (Calc): 161 mg/dL (calc) — ABNORMAL HIGH
Non-HDL Cholesterol (Calc): 190 mg/dL (calc) — ABNORMAL HIGH (ref <130)
Total CHOL/HDL Ratio: 5 (calc) — ABNORMAL HIGH (ref <5.0)
Triglycerides: 145 mg/dL (ref <150)

## 2021-09-11 LAB — HIV ANTIBODY (ROUTINE TESTING W REFLEX): HIV 1&2 Ab, 4th Generation: NONREACTIVE

## 2021-09-11 LAB — HEPATITIS C ANTIBODY: Hepatitis C Ab: NONREACTIVE

## 2021-09-11 LAB — TSH: TSH: 1.89 mIU/L (ref 0.40–4.50)

## 2021-09-11 MED ORDER — GOLYTELY 236 G PO SOLR: 4000.0000 mL | Freq: Once | ORAL | 0 refills | Status: AC

## 2021-09-11 NOTE — Telephone Encounter (Signed)
Gastroenterology Pre-Procedure Review  Request Date: 10/26/21 Requesting Physician: Dr. Vicente Males  PATIENT REVIEW QUESTIONS: The patient responded to the following health history questions as indicated:    1. Are you having any GI issues? no 2. Do you have a personal history of Polyps? no 3. Do you have a family history of Colon Cancer or Polyps?  Grandmother colon cancer and other family members 4. Diabetes Mellitus? no 5. Joint replacements in the past 12 months?no 6. Major health problems in the past 3 months?no 7. Any artificial heart valves, MVP, or defibrillator?no    MEDICATIONS & ALLERGIES:    Patient reports the following regarding taking any anticoagulation/antiplatelet therapy:   Plavix, Coumadin, Eliquis, Xarelto, Lovenox, Pradaxa, Brilinta, or Effient? no Aspirin? no  Patient confirms/reports the following medications:  No current outpatient medications on file.   No current facility-administered medications for this visit.    Patient confirms/reports the following allergies:  No Known Allergies  No orders of the defined types were placed in this encounter.   AUTHORIZATION INFORMATION Primary Insurance: 1D#: Group #:  Secondary Insurance: 1D#: Group #:  SCHEDULE INFORMATION: Date: 10/26/21 Time: Location: [facility]

## 2021-10-26 ENCOUNTER — Ambulatory Visit
Admission: RE | Admit: 2021-10-26 | Discharge: 2021-10-26 | Disposition: A | Discharge status: Home / Self Care | Payer: 59 | Attending: Gastroenterology | Admitting: Gastroenterology

## 2021-10-26 ENCOUNTER — Encounter: Payer: Self-pay | Admitting: Gastroenterology

## 2021-10-26 ENCOUNTER — Encounter
Admission: RE | Disposition: A | Discharge status: Home / Self Care | Payer: Self-pay | Source: Home / Self Care | Attending: Gastroenterology

## 2021-10-26 ENCOUNTER — Ambulatory Visit: Payer: 59 | Admitting: Anesthesiology

## 2021-10-26 DIAGNOSIS — D120 Benign neoplasm of cecum: Secondary | ICD-10-CM | POA: Insufficient documentation

## 2021-10-26 DIAGNOSIS — Z9049 Acquired absence of other specified parts of digestive tract: Secondary | ICD-10-CM | POA: Diagnosis not present

## 2021-10-26 DIAGNOSIS — Z1211 Encounter for screening for malignant neoplasm of colon: Secondary | ICD-10-CM | POA: Diagnosis present

## 2021-10-26 DIAGNOSIS — D126 Benign neoplasm of colon, unspecified: Secondary | ICD-10-CM | POA: Diagnosis not present

## 2021-10-26 DIAGNOSIS — D128 Benign neoplasm of rectum: Secondary | ICD-10-CM | POA: Insufficient documentation

## 2021-10-26 DIAGNOSIS — D122 Benign neoplasm of ascending colon: Secondary | ICD-10-CM | POA: Insufficient documentation

## 2021-10-26 HISTORY — PX: COLONOSCOPY WITH PROPOFOL: SHX5780

## 2021-10-26 SURGERY — COLONOSCOPY WITH PROPOFOL
Anesthesia: General

## 2021-10-26 MED ORDER — LIDOCAINE HCL (CARDIAC) PF 100 MG/5ML IV SOSY
PREFILLED_SYRINGE | INTRAVENOUS | Status: DC | PRN
  Administered 2021-10-26: 60 mg via INTRAVENOUS

## 2021-10-26 MED ORDER — PROPOFOL 10 MG/ML IV BOLUS
INTRAVENOUS | Status: DC | PRN
  Administered 2021-10-26: 140 mg via INTRAVENOUS

## 2021-10-26 MED ORDER — SODIUM CHLORIDE 0.9 % IV SOLN
INTRAVENOUS | Status: DC
  Administered 2021-10-26: 20 mL/h via INTRAVENOUS

## 2021-10-26 MED ORDER — PROPOFOL 500 MG/50ML IV EMUL
INTRAVENOUS | Status: DC | PRN
  Administered 2021-10-26: 140 ug/kg/min via INTRAVENOUS

## 2021-10-26 MED ORDER — STERILE WATER FOR IRRIGATION IR SOLN
Status: DC | PRN
  Administered 2021-10-26: 60 mL

## 2021-10-26 NOTE — Anesthesia Postprocedure Evaluation (Signed)
Anesthesia Post Note  Patient: Sean Norton  Procedure(s) Performed: COLONOSCOPY WITH PROPOFOL  Patient location during evaluation: Endoscopy Anesthesia Type: General Level of consciousness: awake and alert Pain management: pain level controlled Vital Signs Assessment: post-procedure vital signs reviewed and stable Respiratory status: spontaneous breathing, nonlabored ventilation, respiratory function stable and patient connected to nasal cannula oxygen Cardiovascular status: blood pressure returned to baseline and stable Postop Assessment: no apparent nausea or vomiting Anesthetic complications: no   No notable events documented.   Last Vitals:  Vitals:   10/26/21 1037 10/26/21 1047  BP: (!) 148/91 (!) 136/93  Pulse:  77  Resp:    Temp:    SpO2: 96% 99%    Last Pain:  Vitals:   10/26/21 1047  TempSrc:   PainSc: 0-No pain                 Precious Haws Colie Fugitt

## 2021-10-26 NOTE — H&P (Signed)
     Sean Bellows, MD [address] [place], [place], [place], [address] [address], [place], [place], [place], [address] Phone: [phone]  Fax: [phone]  Primary Care Physician:  Bo Merino, FNP   Pre-Procedure History & Physical: HPI:  Sean Norton is a 61 y.o. male is here for an colonoscopy.   Past Medical History:  Diagnosis Date   Kidney stones     Past Surgical History:  Procedure Laterality Date   APPENDECTOMY     CYSTOSCOPY WITH STENT PLACEMENT Left 09/01/2014   Procedure: CYSTOSCOPY WITH STENT PLACEMENT;  Surgeon: Alexis Frock, MD;  Location: [facility] ORS;  Service: Urology;  Laterality: Left;   URETEROSCOPY WITH HOLMIUM LASER LITHOTRIPSY Left 09/01/2014   Procedure: URETEROSCOPY WITH HOLMIUM LASER LITHOTRIPSY;  Surgeon: Alexis Frock, MD;  Location: [facility] ORS;  Service: Urology;  Laterality: Left;    Prior to Admission medications   Not on File    Allergies as of 09/12/2021   (No Known Allergies)    Family History  Problem Relation Age of Onset   Kidney Stones Father    Diabetes Neg Hx     Social History   Socioeconomic History   Marital status: Divorced    Spouse name: Not on file   Number of children: 5   Years of education: Not on file   Highest education level: Not on file  Occupational History   Not on file  Tobacco Use   Smoking status: Never   Smokeless tobacco: Never  Vaping Use   Vaping Use: Never used  Substance and Sexual Activity   Alcohol use: No   Drug use: No   Sexual activity: Yes  Other Topics Concern   Not on file  Social History Narrative   Not on file   Social Determinants of Health   Financial Resource Strain: Not on file  Food Insecurity: Not on file  Transportation Needs: Not on file  Physical Activity: Not on file  Stress: Not on file  Social Connections: Not on file  Intimate Partner Violence: Not on file    Review of Systems: See HPI, otherwise negative ROS  Physical Exam: BP (!) 140/107    Pulse 72   Temp (!) 96.1 F (35.6 C) (Temporal)   Resp 20   Ht '6\' 3"'$  (1.905 m)   Wt 125.2 kg   SpO2 97%   BMI 34.50 kg/m  General:   Alert,  pleasant and cooperative in NAD Head:  Normocephalic and atraumatic. Neck:  Supple; no masses or thyromegaly. Lungs:  Clear throughout to auscultation, normal respiratory effort.    Heart:  +S1, +S2, Regular rate and rhythm, No edema. Abdomen:  Soft, nontender and nondistended. Normal bowel sounds, without guarding, and without rebound.   Neurologic:  Alert and  oriented x4;  grossly normal neurologically.  Impression/Plan: [name] is here for an colonoscopy to be performed for Screening colonoscopy average risk   Risks, benefits, limitations, and alternatives regarding  colonoscopy have been reviewed with the patient.  Questions have been answered.  All parties agreeable.   Sean Bellows, MD  10/26/2021, 9:56 AM

## 2021-10-26 NOTE — Anesthesia Preprocedure Evaluation (Signed)
Anesthesia Evaluation  Patient identified by MRN, date of birth, ID band Patient awake    Reviewed: Allergy & Precautions, NPO status , Patient's Chart, lab work & pertinent test results  History of Anesthesia Complications Negative for: history of anesthetic complications  Airway Mallampati: III  TM Distance: >3 FB Neck ROM: full    Dental  (+) Chipped, Poor Dentition, Missing   Pulmonary neg pulmonary ROS, neg shortness of breath,    Pulmonary exam normal        Cardiovascular Exercise Tolerance: Good + angina Normal cardiovascular exam     Neuro/Psych negative neurological ROS  negative psych ROS   GI/Hepatic negative GI ROS, Neg liver ROS, neg GERD  ,  Endo/Other  negative endocrine ROS  Renal/GU Renal disease  negative genitourinary   Musculoskeletal   Abdominal   Peds  Hematology negative hematology ROS (+)   Anesthesia Other Findings Past Medical History: No date: Kidney stones  Past Surgical History: No date: APPENDECTOMY 09/01/2014: CYSTOSCOPY WITH STENT PLACEMENT; Left     Comment:  Procedure: CYSTOSCOPY WITH STENT PLACEMENT;  Surgeon:               Alexis Frock, MD;  Location: [facility] ORS;  Service:               Urology;  Laterality: Left; 09/01/2014: URETEROSCOPY WITH HOLMIUM LASER LITHOTRIPSY; Left     Comment:  Procedure: URETEROSCOPY WITH HOLMIUM LASER LITHOTRIPSY;               Surgeon: Alexis Frock, MD;  Location: [facility] ORS;                Service: Urology;  Laterality: Left;  BMI    Body Mass Index: 34.50 kg/m      Reproductive/Obstetrics negative OB ROS                             Anesthesia Physical Anesthesia Plan  ASA: 2  Anesthesia Plan: General   Post-op Pain Management:    Induction: Intravenous  PONV Risk Score and Plan: Propofol infusion and TIVA  Airway Management Planned: Natural Airway and Nasal Cannula  Additional Equipment:    Intra-op Plan:   Post-operative Plan:   Informed Consent: I have reviewed the patients History and Physical, chart, labs and discussed the procedure including the risks, benefits and alternatives for the proposed anesthesia with the patient or authorized representative who has indicated his/her understanding and acceptance.     Dental Advisory Given  Plan Discussed with: Anesthesiologist, CRNA and Surgeon  Anesthesia Plan Comments: (Patient consented for risks of anesthesia including but not limited to:  - adverse reactions to medications - risk of airway placement if required - damage to eyes, teeth, lips or other oral mucosa - nerve damage due to positioning  - sore throat or hoarseness - Damage to heart, brain, nerves, lungs, other parts of body or loss of life  Patient voiced understanding.)        Anesthesia Quick Evaluation

## 2021-10-26 NOTE — Transfer of Care (Signed)
Immediate Anesthesia Transfer of Care Note  Patient: Sean Norton  Procedure(s) Performed: COLONOSCOPY WITH PROPOFOL  Patient Location: PACU  Anesthesia Type:General  Level of Consciousness: awake, alert  and oriented  Airway & Oxygen Therapy: Patient Spontanous Breathing  Post-op Assessment: Report given to RN and Post -op Vital signs reviewed and stable  Post vital signs: Reviewed and stable  Last Vitals:  Vitals Value Taken Time  BP 136/81 10/26/21 1027  Temp    Pulse 82 10/26/21 1027  Resp 17 10/26/21 1027  SpO2 94 % 10/26/21 1027    Last Pain:  Vitals:   10/26/21 0917  TempSrc: Temporal  PainSc: 8          Complications: No notable events documented.

## 2021-10-26 NOTE — Op Note (Signed)
[facility] Gastroenterology Patient Name: Sean Norton Procedure Date: 10/26/2021 9:53 AM MRN: [id number] Account #: [id number] Date of Birth: [birth year] Admit Type: Outpatient Age: 61 Room: [facility] ENDO ROOM 4 Gender: Male Note Status: Finalized Instrument Name: Jasper Riling [id number] Procedure:             Colonoscopy Indications:           Screening for colorectal malignant neoplasm Providers:             Jonathon Bellows MD, MD Referring MD:          Myna Hidalgo. Reece Packer (Referring MD) Medicines:             Monitored Anesthesia Care Complications:         No immediate complications. Procedure:             Pre-Anesthesia Assessment:                        - Prior to the procedure, a History and Physical was                         performed, and patient medications, allergies and                         sensitivities were reviewed. The patient's tolerance                         of previous anesthesia was reviewed.                        - The risks and benefits of the procedure and the                         sedation options and risks were discussed with the                         patient. All questions were answered and informed                         consent was obtained.                        - ASA Grade Assessment: II - A patient with mild                         systemic disease.                        After obtaining informed consent, the colonoscope was                         passed under direct vision. Throughout the procedure,                         the patient's blood pressure, pulse, and oxygen                         saturations were monitored continuously. The                         Colonoscope was introduced  through the anus and                         advanced to the the cecum, identified by the                         appendiceal orifice. The colonoscopy was performed                         with ease. The patient tolerated the procedure well.                          The quality of the bowel preparation was excellent. Findings:      The perianal and digital rectal examinations were normal.      Two sessile polyps were found in the rectum. The polyps were 4 to 5 mm       in size. These polyps were removed with a cold snare. Resection and       retrieval were complete.      Two sessile polyps were found in the ascending colon and cecum. The       polyps were 15 to 20 mm in size. Preparations were made for mucosal       resection. Saline was injected to raise the lesion. Snare mucosal       resection was performed. Resection and retrieval were complete. borders       of polyp marked using blue or nbi light      The exam was otherwise without abnormality on direct and retroflexion       views. Impression:            - Two 4 to 5 mm polyps in the rectum, removed with a                         cold snare. Resected and retrieved.                        - Two 15 to 20 mm polyps in the ascending colon and in                         the cecum, removed with mucosal resection. Resected                         and retrieved.                        - The examination was otherwise normal on direct and                         retroflexion views.                        - Mucosal resection was performed. Resection and                         retrieval were complete. Recommendation:        - Discharge patient to home (with escort).                        - Resume previous diet.                        -  Continue present medications.                        - Await pathology results.                        - Repeat colonoscopy for surveillance based on                         pathology results. Procedure Code(s):     --- Professional ---                        [phone], Colonoscopy, flexible; with endoscopic mucosal                         resection                        [id number], 19, Colonoscopy, flexible; with removal of                         tumor(s),  polyp(s), or other lesion(s) by snare                         technique Diagnosis Code(s):     --- Professional ---                        Z12.11, Encounter for screening for malignant neoplasm                         of colon                        K62.1, Rectal polyp                        K63.5, Polyp of colon CPT copyright 2019 American Medical Association. All rights reserved. The codes documented in this report are preliminary and upon coder review may  be revised to meet current compliance requirements. Jonathon Bellows, MD Jonathon Bellows MD, MD 10/26/2021 10:27:25 AM This report has been signed electronically. Number of Addenda: 0 Note Initiated On: 10/26/2021 9:53 AM Scope Withdrawal Time: 0 hours 14 minutes 30 seconds  Total Procedure Duration: 0 hours 16 minutes 14 seconds  Estimated Blood Loss:  Estimated blood loss: none.      [facility]

## 2021-10-29 LAB — SURGICAL PATHOLOGY

## 2021-10-31 ENCOUNTER — Encounter: Payer: Self-pay | Admitting: Gastroenterology

## 2022-03-14 ENCOUNTER — Encounter: Payer: Self-pay | Admitting: Nurse Practitioner

## 2022-05-10 ENCOUNTER — Other Ambulatory Visit: Payer: Self-pay

## 2022-05-10 ENCOUNTER — Emergency Department: Payer: Managed Care, Other (non HMO)

## 2022-05-10 ENCOUNTER — Emergency Department
Admission: EM | Admit: 2022-05-10 | Discharge: 2022-05-10 | Disposition: A | Discharge status: Home / Self Care | Payer: Managed Care, Other (non HMO) | Attending: Emergency Medicine | Admitting: Emergency Medicine

## 2022-05-10 DIAGNOSIS — R0789 Other chest pain: Secondary | ICD-10-CM | POA: Diagnosis present

## 2022-05-10 DIAGNOSIS — M25512 Pain in left shoulder: Secondary | ICD-10-CM | POA: Diagnosis not present

## 2022-05-10 DIAGNOSIS — M542 Cervicalgia: Secondary | ICD-10-CM | POA: Diagnosis not present

## 2022-05-10 DIAGNOSIS — R0602 Shortness of breath: Secondary | ICD-10-CM | POA: Diagnosis not present

## 2022-05-10 LAB — BASIC METABOLIC PANEL
Anion gap: 10 (ref 5–15)
BUN: 14 mg/dL (ref 6–20)
CO2: 25 mmol/L (ref 22–32)
Calcium: 9.3 mg/dL (ref 8.9–10.3)
Chloride: 102 mmol/L (ref 98–111)
Creatinine, Ser: 0.81 mg/dL (ref 0.61–1.24)
GFR, Estimated: >60 mL/min (ref >60)
Glucose, Bld: 98 mg/dL (ref 70–99)
Potassium: 3.7 mmol/L (ref 3.5–5.1)
Sodium: 137 mmol/L (ref 135–145)

## 2022-05-10 LAB — TROPONIN I (HIGH SENSITIVITY)
Troponin I (High Sensitivity): 3 ng/L (ref <18)
Troponin I (High Sensitivity): 3 ng/L (ref <18)

## 2022-05-10 LAB — CBC
HCT: 44.9 % (ref 39.0–52.0)
Hemoglobin: 15.4 g/dL (ref 13.0–17.0)
MCH: 31.8 pg (ref 26.0–34.0)
MCHC: 34.3 g/dL (ref 30.0–36.0)
MCV: 92.8 fL (ref 80.0–100.0)
Platelets: 230 10*3/uL (ref 150–400)
RBC: 4.84 MIL/uL (ref 4.22–5.81)
RDW: 12.1 % (ref 11.5–15.5)
WBC: 3.8 10*3/uL — ABNORMAL LOW (ref 4.0–10.5)
nRBC: 0 % (ref 0.0–0.2)

## 2022-05-10 MED ORDER — PANTOPRAZOLE SODIUM 40 MG PO TBEC: 40.0000 mg | DELAYED_RELEASE_TABLET | Freq: Every day | ORAL | 11 refills | Status: AC

## 2022-05-10 NOTE — ED Provider Notes (Signed)
[facility] Provider Note    Event Date/Time   First MD Initiated Contact with Patient 05/10/22 1034     (approximate)   History   Shortness of Breath   HPI  Sean Norton is a 62 y.o. male with a history of kidney stones who presents with shortness of breath over the last few days somewhat associated with exertion, as well as chest pain mainly in the center and upper part of his chest bilaterally over the last 3 to 4 days.  The patient thought he may have acid reflux.  The symptoms are sometimes worse after eating and at night when he lies down.  He denies any shortness of breath currently and has no active chest pain at this time.  He does not feel dizzy or lightheaded.  He has no nausea or vomiting.  He was seen in urgent care and sent to the ED for cardiac workup.  The patient also reports a few episodes during which it feels like his left arm will give out, but denies any ongoing weakness or numbness.  He sometimes has pain radiating from his neck to his left shoulder.  I reviewed the past medical records.  The patient's most recent outpatient encounter was in September of last year for colonoscopy.  He has no recent ED visits or admissions.   Physical Exam   Triage Vital Signs: ED Triage Vitals  Enc Vitals Group     BP --      Pulse --      Resp --      Temp --      Temp src --      SpO2 --      Weight 05/10/22 1032 275 lb 9.2 oz (125 kg)     Height 05/10/22 1032 6\' 3"  (1.905 m)     Head Circumference --      Peak Flow --      Pain Score 05/10/22 1031 3     Pain Loc --      Pain Edu? --      Excl. in [place]? --     Most recent vital signs: Vitals:   05/10/22 1500 05/10/22 1517  BP:  (!) 142/96  Pulse: 78   Resp:    Temp:    SpO2: 91%      General: Awake, no distress.  CV:  Good peripheral perfusion.  Normal heart sounds. Resp:  Normal effort.  Lungs CTAB. Abd:  No distention.  Other:  5/5 motor strength and intact sensation of  bilateral upper extremities.  No midline cervical spinal tenderness.   ED Results / Procedures / Treatments   Labs (all labs ordered are listed, but only abnormal results are displayed) Labs Reviewed  CBC - Abnormal; Notable for the following components:      Result Value   WBC 3.8 (*)    All other components within normal limits  BASIC METABOLIC PANEL  TROPONIN I (HIGH SENSITIVITY)  TROPONIN I (HIGH SENSITIVITY)     EKG  ED ECG REPORT I, Arta Silence, the attending physician, personally viewed and interpreted this ECG.  Date: 05/10/2022 EKG Time: 1036 Rate: 76 Rhythm: normal sinus rhythm QRS Axis: normal Intervals: normal ST/T Wave abnormalities: Nonspecific T wave abnormalities Narrative Interpretation: no evidence of acute ischemia    RADIOLOGY  Chest x-ray: I independently viewed and interpreted the images; there is no focal consolidation or edema   PROCEDURES:  Critical Care performed: No  Procedures  MEDICATIONS ORDERED IN ED: Medications - No data to display   IMPRESSION / MDM / [place] / ED COURSE  I reviewed the triage vital signs and the nursing notes.  62 year old male with PMH as noted above presents with atypical intermittent chest pain and some difficulty breathing over the last few days that he attributes to possible acid reflux.  He was sent from urgent care for cardiac enzymes  Physical exam is unremarkable.   Differential diagnosis includes, but is not limited to, GERD, musculoskeletal chest wall pain, less likely ACS.  I do not suspect acute CHF.  The patient cannot be ruled out for PE by [facility] due to his age but has no other PERC risk factors and no signs or symptoms to suggest DVT or PE.  He has no evidence of artery dissection or other vascular cause given the quality and intermittent nature of the pain.  Will obtain basic labs, cardiac enzymes x 2, and reassess.  Patient's presentation is most consistent with acute  complicated illness / injury requiring diagnostic workup.  The patient is on the cardiac monitor to evaluate for evidence of arrhythmia and/or significant heart rate changes.  ----------------------------------------- 3:40 PM on 05/10/2022 -----------------------------------------  The patient has had no further pain or shortness of breath in the ED.  Lab workup is unremarkable with normal cardiac enzymes x 2 and no leukocytosis or anemia on the CBC.  Chest x-ray is clear.  Overall presentation is consistent with GERD or other noncardiac etiology.  I have prescribed Protonix.  The patient is stable for discharge home.  I gave him strict return precautions and he expressed understanding.  He will follow-up with his PMD.   FINAL CLINICAL IMPRESSION(S) / ED DIAGNOSES   Final diagnoses:  Atypical chest pain     Rx / DC Orders   ED Discharge Orders          Ordered    pantoprazole (PROTONIX) 40 MG tablet  Daily        05/10/22 1509             Note:  This document was prepared using Dragon voice recognition software and may include unintentional dictation errors.    Arta Silence, MD 05/10/22 [phone]

## 2022-05-10 NOTE — ED Triage Notes (Signed)
Pt to ED via POV from PCP. Pt reports SOB and reflux symptoms x3 days that have gotten worse. Pt ambulatory to triage with steady gait.

## 2022-05-27 NOTE — Progress Notes (Signed)
   There were no vitals taken for this visit.   Subjective:    Patient ID: Sean Norton, male    DOB: October 07, 1960, 62 y.o.   MRN: [id number]  HPI: Sean Norton is a 63 y.o. male  No chief complaint on file.   Relevant past medical, surgical, family and social history reviewed and updated as indicated. Interim medical history since our last visit reviewed. Allergies and medications reviewed and updated.  Review of Systems  Constitutional: Negative for fever or weight change.  Respiratory: Negative for cough and shortness of breath.   Cardiovascular: Negative for chest pain or palpitations.  Gastrointestinal: Negative for abdominal pain, no bowel changes.  Musculoskeletal: Negative for gait problem or joint swelling.  Skin: Negative for rash.  Neurological: Negative for dizziness or headache.  No other specific complaints in a complete review of systems (except as listed in HPI above).      Objective:    There were no vitals taken for this visit.  Wt Readings from Last 3 Encounters:  05/10/22 275 lb 9.2 oz (125 kg)  10/26/21 276 lb (125.2 kg)  09/10/21 268 lb 9.6 oz (121.8 kg)    Physical Exam  Constitutional: Patient appears well-developed and well-nourished. Obese *** No distress.  HEENT: head atraumatic, normocephalic, pupils equal and reactive to light, ears ***, neck supple, throat within normal limits Cardiovascular: Normal rate, regular rhythm and normal heart sounds.  No murmur heard. No BLE edema. Pulmonary/Chest: Effort normal and breath sounds normal. No respiratory distress. Abdominal: Soft.  There is no tenderness. Psychiatric: Patient has a normal mood and affect. behavior is normal. Judgment and thought content normal.   Results for orders placed or performed during the hospital encounter of 05/10/22  CBC  Result Value Ref Range   WBC 3.8 (L) 4.0 - 10.5 K/uL   RBC 4.84 4.22 - 5.81 MIL/uL   Hemoglobin 15.4 13.0 - 17.0 g/dL   HCT 29.5 18.8 - 41.6 %   MCV  92.8 80.0 - 100.0 fL   MCH 31.8 26.0 - 34.0 pg   MCHC 34.3 30.0 - 36.0 g/dL   RDW 60.6 30.1 - 60.1 %   Platelets 230 150 - 400 K/uL   nRBC 0.0 0.0 - 0.2 %  Basic metabolic panel  Result Value Ref Range   Sodium 137 135 - 145 mmol/L   Potassium 3.7 3.5 - 5.1 mmol/L   Chloride 102 98 - 111 mmol/L   CO2 25 22 - 32 mmol/L   Glucose, Bld 98 70 - 99 mg/dL   BUN 14 6 - 20 mg/dL   Creatinine, Ser 0.93 0.61 - 1.24 mg/dL   Calcium 9.3 8.9 - 23.5 mg/dL   GFR, Estimated >57 >32 mL/min   Anion gap 10 5 - 15  Troponin I (High Sensitivity)  Result Value Ref Range   Troponin I (High Sensitivity) 3 <18 ng/L  Troponin I (High Sensitivity)  Result Value Ref Range   Troponin I (High Sensitivity) 3 <18 ng/L      Assessment & Plan:   Problem List Items Addressed This Visit   None    Follow up plan: No follow-ups on file.

## 2022-05-28 ENCOUNTER — Ambulatory Visit: Payer: Managed Care, Other (non HMO) | Admitting: Nurse Practitioner

## 2022-05-28 ENCOUNTER — Other Ambulatory Visit: Payer: Self-pay

## 2022-05-28 ENCOUNTER — Encounter: Payer: Self-pay | Admitting: Nurse Practitioner

## 2022-05-28 VITALS — BP 130/72 | HR 75 | Temp 97.7°F | Resp 18 | Ht 75.0 in | Wt 268.2 lb

## 2022-05-28 DIAGNOSIS — R072 Precordial pain: Secondary | ICD-10-CM

## 2022-05-28 DIAGNOSIS — R9389 Abnormal findings on diagnostic imaging of other specified body structures: Secondary | ICD-10-CM | POA: Diagnosis not present

## 2022-05-28 DIAGNOSIS — R0602 Shortness of breath: Secondary | ICD-10-CM | POA: Diagnosis not present

## 2022-06-14 ENCOUNTER — Ambulatory Visit
Admission: RE | Admit: 2022-06-14 | Discharge: 2022-06-14 | Disposition: A | Discharge status: Home / Self Care | Payer: Managed Care, Other (non HMO) | Source: Ambulatory Visit | Attending: Nurse Practitioner | Admitting: Nurse Practitioner

## 2022-06-14 ENCOUNTER — Other Ambulatory Visit: Payer: Self-pay | Admitting: Nurse Practitioner

## 2022-06-14 DIAGNOSIS — R9389 Abnormal findings on diagnostic imaging of other specified body structures: Secondary | ICD-10-CM

## 2022-06-14 DIAGNOSIS — R0602 Shortness of breath: Secondary | ICD-10-CM | POA: Diagnosis present

## 2022-06-14 DIAGNOSIS — R072 Precordial pain: Secondary | ICD-10-CM

## 2022-06-14 MED ORDER — IOHEXOL 350 MG/ML SOLN
75.0000 mL | Freq: Once | INTRAVENOUS | Status: AC | PRN
  Administered 2022-06-14: 75 mL via INTRAVENOUS

## 2022-07-06 ENCOUNTER — Other Ambulatory Visit: Payer: Self-pay

## 2022-07-06 ENCOUNTER — Ambulatory Visit
Admission: EM | Admit: 2022-07-06 | Discharge: 2022-07-06 | Disposition: A | Discharge status: Home / Self Care | Payer: Managed Care, Other (non HMO) | Attending: Emergency Medicine | Admitting: Emergency Medicine

## 2022-07-06 ENCOUNTER — Encounter
Admission: EM | Disposition: A | Discharge status: Home / Self Care | Payer: Self-pay | Source: Home / Self Care | Attending: Emergency Medicine

## 2022-07-06 ENCOUNTER — Ambulatory Visit: Payer: Managed Care, Other (non HMO) | Admitting: General Practice

## 2022-07-06 DIAGNOSIS — W449XXA Unspecified foreign body entering into or through a natural orifice, initial encounter: Secondary | ICD-10-CM | POA: Insufficient documentation

## 2022-07-06 DIAGNOSIS — K2100 Gastro-esophageal reflux disease with esophagitis, without bleeding: Secondary | ICD-10-CM | POA: Diagnosis not present

## 2022-07-06 DIAGNOSIS — T18128A Food in esophagus causing other injury, initial encounter: Secondary | ICD-10-CM | POA: Diagnosis not present

## 2022-07-06 DIAGNOSIS — W44F3XA Food entering into or through a natural orifice, initial encounter: Secondary | ICD-10-CM

## 2022-07-06 HISTORY — PX: ESOPHAGOGASTRODUODENOSCOPY: SHX5428

## 2022-07-06 LAB — CBC
HCT: 43.1 % (ref 39.0–52.0)
Hemoglobin: 15 g/dL (ref 13.0–17.0)
MCH: 31.6 pg (ref 26.0–34.0)
MCHC: 34.8 g/dL (ref 30.0–36.0)
MCV: 90.7 fL (ref 80.0–100.0)
Platelets: 259 10*3/uL (ref 150–400)
RBC: 4.75 MIL/uL (ref 4.22–5.81)
RDW: 11.9 % (ref 11.5–15.5)
WBC: 6 10*3/uL (ref 4.0–10.5)
nRBC: 0 % (ref 0.0–0.2)

## 2022-07-06 LAB — COMPREHENSIVE METABOLIC PANEL
ALT: 27 U/L (ref 0–44)
AST: 23 U/L (ref 15–41)
Albumin: 4 g/dL (ref 3.5–5.0)
Alkaline Phosphatase: 69 U/L (ref 38–126)
Anion gap: 9 (ref 5–15)
BUN: 18 mg/dL (ref 6–20)
CO2: 23 mmol/L (ref 22–32)
Calcium: 8.3 mg/dL — ABNORMAL LOW (ref 8.9–10.3)
Chloride: 107 mmol/L (ref 98–111)
Creatinine, Ser: 0.99 mg/dL (ref 0.61–1.24)
GFR, Estimated: >60 mL/min (ref >60)
Glucose, Bld: 132 mg/dL — ABNORMAL HIGH (ref 70–99)
Potassium: 3.7 mmol/L (ref 3.5–5.1)
Sodium: 139 mmol/L (ref 135–145)
Total Bilirubin: 0.6 mg/dL (ref 0.3–1.2)
Total Protein: 7.2 g/dL (ref 6.5–8.1)

## 2022-07-06 SURGERY — EGD (ESOPHAGOGASTRODUODENOSCOPY)
Anesthesia: Choice

## 2022-07-06 MED ORDER — SUCCINYLCHOLINE CHLORIDE 200 MG/10ML IV SOSY
PREFILLED_SYRINGE | INTRAVENOUS | Status: AC
  Filled 2022-07-06: qty 10

## 2022-07-06 MED ORDER — PROPOFOL 10 MG/ML IV BOLUS
INTRAVENOUS | Status: AC
  Filled 2022-07-06: qty 20

## 2022-07-06 MED ORDER — ONDANSETRON HCL 4 MG/2ML IJ SOLN
4.0000 mg | Freq: Once | INTRAMUSCULAR | Status: AC
  Administered 2022-07-06: 4 mg via INTRAVENOUS
  Filled 2022-07-06: qty 2

## 2022-07-06 MED ORDER — ONDANSETRON HCL 4 MG/2ML IJ SOLN
INTRAMUSCULAR | Status: AC
  Filled 2022-07-06: qty 2

## 2022-07-06 MED ORDER — MIDAZOLAM HCL 2 MG/2ML IJ SOLN
INTRAMUSCULAR | Status: AC
  Filled 2022-07-06: qty 2

## 2022-07-06 MED ORDER — FENTANYL CITRATE (PF) 100 MCG/2ML IJ SOLN
INTRAMUSCULAR | Status: DC | PRN
  Administered 2022-07-06: 50 ug via INTRAVENOUS

## 2022-07-06 MED ORDER — DEXAMETHASONE SODIUM PHOSPHATE 10 MG/ML IJ SOLN
INTRAMUSCULAR | Status: DC | PRN
  Administered 2022-07-06: 10 mg via INTRAVENOUS

## 2022-07-06 MED ORDER — FENTANYL CITRATE (PF) 100 MCG/2ML IJ SOLN
INTRAMUSCULAR | Status: AC
  Filled 2022-07-06: qty 2

## 2022-07-06 MED ORDER — SUCCINYLCHOLINE CHLORIDE 200 MG/10ML IV SOSY
PREFILLED_SYRINGE | INTRAVENOUS | Status: DC | PRN
  Administered 2022-07-06: 120 mg via INTRAVENOUS

## 2022-07-06 MED ORDER — MIDAZOLAM HCL 2 MG/2ML IJ SOLN
INTRAMUSCULAR | Status: DC | PRN
  Administered 2022-07-06: 2 mg via INTRAVENOUS

## 2022-07-06 MED ORDER — PROPOFOL 10 MG/ML IV BOLUS
INTRAVENOUS | Status: DC | PRN
  Administered 2022-07-06: 200 mg via INTRAVENOUS

## 2022-07-06 MED ORDER — SODIUM CHLORIDE 0.9 % IV BOLUS
1000.0000 mL | Freq: Once | INTRAVENOUS | Status: AC
  Administered 2022-07-06: 1000 mL via INTRAVENOUS

## 2022-07-06 MED ORDER — SODIUM CHLORIDE 0.9 % IV SOLN: INTRAVENOUS | Status: DC | PRN

## 2022-07-06 MED ORDER — CALCIUM GLUCONATE-NACL 1-0.675 GM/50ML-% IV SOLN
1.0000 g | Freq: Once | INTRAVENOUS | Status: AC
  Administered 2022-07-06: 1000 mg via INTRAVENOUS
  Filled 2022-07-06: qty 50

## 2022-07-06 MED ORDER — LIDOCAINE HCL (CARDIAC) PF 100 MG/5ML IV SOSY
PREFILLED_SYRINGE | INTRAVENOUS | Status: DC | PRN
  Administered 2022-07-06: 100 mg via INTRAVENOUS

## 2022-07-06 MED ORDER — ONDANSETRON HCL 4 MG/2ML IJ SOLN
INTRAMUSCULAR | Status: DC | PRN
  Administered 2022-07-06: 4 mg via INTRAVENOUS

## 2022-07-06 NOTE — Op Note (Signed)
[facility] Patient Name: Sean Norton Procedure Date: 07/06/2022 8:42 PM MRN: [id number] Account #: [id number] Date of Birth: [birth year] Admit Type: Outpatient Age: 62 Room: [facility] ENDO ROOM 4 Gender: Male Note Status: Finalized Instrument Name: Upper Endoscope [id number] Procedure:             Upper GI endoscopy Indications:           Foreign body in the esophagus Providers:             Wyline Mood MD, MD Referring MD:          No Local Md, MD (Referring MD) Medicines:             Monitored Anesthesia Care Complications:         No immediate complications. Procedure:             Pre-Anesthesia Assessment:                        - Prior to the procedure, a History and Physical was                         performed, and patient medications, allergies and                         sensitivities were reviewed. The patient's tolerance                         of previous anesthesia was reviewed.                        - The risks and benefits of the procedure and the                         sedation options and risks were discussed with the                         patient. All questions were answered and informed                         consent was obtained.                        - ASA Grade Assessment: II - A patient with mild                         systemic disease.                        After obtaining informed consent, the endoscope was                         passed under direct vision. Throughout the procedure,                         the patient's blood pressure, pulse, and oxygen                         saturations were monitored continuously. The Endoscope  was introduced through the mouth, and advanced to the                         third part of duodenum. The upper GI endoscopy was                         accomplished with ease. The patient tolerated the                         procedure well. Findings:      LA Grade A  (one or more mucosal breaks less than 5 mm, not extending       between tops of 2 mucosal folds) esophagitis was found in the lower       third of the esophagus.      A large amount of food (residue) was found in the entire examined       stomach.      The examined duodenum was normal.      The cardia and gastric fundus were normal on retroflexion. Impression:            - LA Grade A esophagitis.                        - A large amount of food (residue) in the stomach.                        - Normal examined duodenum.                        - No specimens collected. Recommendation:        - Discharge patient to home (with escort).                        - Mechanical soft diet for 4 weeks.                        - Use Prilosec (omeprazole) 40 mg PO daily for 3                         months.                        - Repeat upper endoscopy in 4 weeks to evaluate the                         response to therapy.                        - Return to GI office in 8 weeks. Procedure Code(s):     --- Professional ---                        [phone], Esophagogastroduodenoscopy, flexible,                         transoral; diagnostic, including collection of                         specimen(s) by brushing or washing, when performed                         (  separate procedure) Diagnosis Code(s):     --- Professional ---                        T18.108A, Unspecified foreign body in esophagus                         causing other injury, initial encounter                        K20.90, Esophagitis, unspecified without bleeding CPT copyright 2022 American Medical Association. All rights reserved. The codes documented in this report are preliminary and upon coder review may  be revised to meet current compliance requirements. Wyline Mood, MD Wyline Mood MD, MD 07/06/2022 9:31:58 PM This report has been signed electronically. Number of Addenda: 0 Note Initiated On: 07/06/2022 8:42 PM Estimated Blood Loss:   Estimated blood loss: none.      [facility]

## 2022-07-06 NOTE — ED Provider Notes (Signed)
   [facility] Provider Note    Event Date/Time   First MD Initiated Contact with Patient 07/06/22 1825     (approximate)  History   Chief Complaint: food bolus (Stuck in throat)  HPI  Sean Norton is a 62 y.o. male with no significant past medical history presents emergency department with a sensation of food stuck in his esophagus.  According to the patient he was eating chicken breast for dinner tonight when he felt like something got stuck.  He could no longer swallow when he attempted to swallow the food or water came right back up.  Patient denies any history of this previously.  Does state a history of gastric reflux and often times get heartburn at night.  Patient attempted to drink water with immediate regurgitation.  Physical Exam   Triage Vital Signs: ED Triage Vitals [07/06/22 1816]  Enc Vitals Group     BP 131/87     Pulse Rate 90     Resp 16     Temp 98 F (36.7 C)     Temp Source Oral     SpO2 94 %     Weight 276 lb (125.2 kg)     Height 6\' 3"  (1.905 m)     Head Circumference      Peak Flow      Pain Score 0     Pain Loc      Pain Edu?      Excl. in GC?     Most recent vital signs: Vitals:   07/06/22 1816  BP: 131/87  Pulse: 90  Resp: 16  Temp: 98 F (36.7 C)  SpO2: 94%    General: Awake, no distress.  CV:  Good peripheral perfusion.  Regular rate and rhythm  Resp:  Normal effort.  Equal breath sounds bilaterally.  Abd:  No distention.  Soft, nontender.  No rebound or guarding.  ED Results / Procedures / Treatments   MEDICATIONS ORDERED IN ED: Medications  calcium gluconate 1 g/ 50 mL sodium chloride IVPB (has no administration in time range)  sodium chloride 0.9 % bolus 1,000 mL (has no administration in time range)     IMPRESSION / MDM / ASSESSMENT AND PLAN / ED COURSE  I reviewed the triage vital signs and the nursing notes.  Patient's presentation is most consistent with acute presentation with potential threat  to life or bodily function.  Patient presents to the emergency department with possible esophageal obstruction.  Patient was eating chicken breast tonight when he felt like something got stuck in the esophagus.  Attempted to drink water to clear the obstruction without success.  Patient spitting secretions into a bucket.  We will place an IV check basic labs and dose glucagon as well as IV fluids.  If glucagon is unsuccessful in relaxing the esophagus and allowing the food bolus to pass we will contact GI medicine for further intervention.  Patient's lab work shows no significant finding normal CBC normal chemistry.  After glucagon patient continues to spit out secretions, unable to swallow even secretions.  I spoke to Dr. Tobi Bastos of GI medicine will begin to perform an endoscopy.  Patient agreeable to plan.  FINAL CLINICAL IMPRESSION(S) / ED DIAGNOSES   Esophageal food bolus obstruction   Note:  This document was prepared using Dragon voice recognition software and may include unintentional dictation errors.   Minna Antis, MD 07/06/22 2005

## 2022-07-06 NOTE — Consult Note (Signed)
Wyline Mood , MD [address], [address], [place], [place], [address] [address], [address], [place], [place], [address] Phone: [phone]  Fax: [phone]  Consultation  Referring Provider: Dr. Lenard Lance Primary Care Physician:  Berniece Salines, FNP Primary Gastroenterologist:  Dr. Tobi Bastos Reason for Consultation:   Dysphagia  Date of Admission:  07/06/2022 Date of Consultation:  07/06/2022         HPI:   Sean Norton is a 62 y.o. male comes into the ER after having a sensation of food stuck in his esophagus after eating chicken breast at around 5 PM tonight.  Unable to tolerate secretions.  Not on any blood thinners.  No similar episodes in the past.  He suffers from acid reflux.  Given glucagon.  Did not have.  Hemoglobin 15 g and CMP normal.  Past Medical History:  Diagnosis Date   Kidney stones     Past Surgical History:  Procedure Laterality Date   APPENDECTOMY     COLONOSCOPY WITH PROPOFOL N/A 10/26/2021   Procedure: COLONOSCOPY WITH PROPOFOL;  Surgeon: Wyline Mood, MD;  Location: [facility] ENDOSCOPY;  Service: Gastroenterology;  Laterality: N/A;   CYSTOSCOPY WITH STENT PLACEMENT Left 09/01/2014   Procedure: CYSTOSCOPY WITH STENT PLACEMENT;  Surgeon: Sebastian Ache, MD;  Location: [facility] ORS;  Service: Urology;  Laterality: Left;   URETEROSCOPY WITH HOLMIUM LASER LITHOTRIPSY Left 09/01/2014   Procedure: URETEROSCOPY WITH HOLMIUM LASER LITHOTRIPSY;  Surgeon: Sebastian Ache, MD;  Location: [facility] ORS;  Service: Urology;  Laterality: Left;    Prior to Admission medications   Medication Sig Start Date End Date Taking? Authorizing Provider  pantoprazole (PROTONIX) 40 MG tablet Take 1 tablet (40 mg total) by mouth daily. 05/10/22 05/10/23  Dionne Bucy, MD    Family History  Problem Relation Age of Onset   Kidney Stones Father    Diabetes Neg Hx      Social History   Tobacco Use   Smoking status: Never   Smokeless tobacco: Never  Vaping Use   Vaping Use: Never used   Substance Use Topics   Alcohol use: No   Drug use: No    Allergies as of 07/06/2022   (No Known Allergies)    Review of Systems:    All systems reviewed and negative except where noted in HPI.   Physical Exam:  Vital signs in last 24 hours: Temp:  [97 F (36.1 C)-98 F (36.7 C)] 97 F (36.1 C) (05/18 2106) Pulse Rate:  [76-90] 76 (05/18 2106) Resp:  [16-18] 18 (05/18 2106) BP: (110-160)/(78-87) 160/84 (05/18 2106) SpO2:  [94 %-97 %] 96 % (05/18 2106) Weight:  [125.2 kg] 125.2 kg (05/18 2106)   General:   Pleasant, cooperative in NAD Head:  Normocephalic and atraumatic. Eyes:   No icterus.   Conjunctiva pink. PERRLA. Ears:  Normal auditory acuity. Neck:  Supple; no masses or thyroidomegaly Lungs: Respirations even and unlabored. Lungs clear to auscultation bilaterally.   No wheezes, crackles, or rhonchi.  Heart:  Regular rate and rhythm;  Without murmur, clicks, rubs or gallops Abdomen:  Soft, nondistended, nontender. Normal bowel sounds. No appreciable masses or hepatomegaly.  No rebound or guarding.  Neurologic:  Alert and oriented x3;  grossly normal neurologically. Skin:  Intact without significant lesions or rashes. Cervical Nodes:  No significant cervical adenopathy. Psych:  Alert and cooperative. Normal affect.  LAB RESULTS: Recent Labs    07/06/22 1847  WBC 6.0  HGB 15.0  HCT 43.1  PLT 259  BMET Recent Labs    07/06/22 1847  NA 139  K 3.7  CL 107  CO2 23  GLUCOSE 132*  BUN 18  CREATININE 0.99  CALCIUM 8.3*   LFT Recent Labs    07/06/22 1847  PROT 7.2  ALBUMIN 4.0  AST 23  ALT 27  ALKPHOS 69  BILITOT 0.6   PT/INR No results for input(s): "LABPROT", "INR" in the last 72 hours.  STUDIES: No results found.    Impression / Plan:   Sean Norton is a 62 y.o. y/o male with with dysphagia likely secondary to obstruction by a food bolus.  History of reflux suggestive of possible underlying stricture.  Plan 1.  EGD   I have discussed  alternative options, risks & benefits,  which include, but are not limited to, bleeding, infection, perforation,respiratory complication & drug reaction.  The patient agrees with this plan & written consent will be obtained.     Thank you for involving me in the care of this patient.      LOS: 0 days   Wyline Mood, MD  07/06/2022, 9:08 PM

## 2022-07-06 NOTE — Anesthesia Postprocedure Evaluation (Signed)
Anesthesia Post Note  Patient: YOMAR MANNERS  Procedure(s) Performed: ESOPHAGOGASTRODUODENOSCOPY (EGD)  Patient location during evaluation: Endoscopy Anesthesia Type: General Level of consciousness: awake and alert Pain management: pain level controlled Vital Signs Assessment: post-procedure vital signs reviewed and stable Respiratory status: spontaneous breathing, nonlabored ventilation, respiratory function stable and patient connected to nasal cannula oxygen Cardiovascular status: blood pressure returned to baseline and stable Postop Assessment: no apparent nausea or vomiting Anesthetic complications: no  No notable events documented.   Last Vitals:  Vitals:   07/06/22 2150 07/06/22 2200  BP: (!) 171/92 (!) 145/98  Pulse: 84   Resp: 17   Temp:    SpO2: 99%     Last Pain:  Vitals:   07/06/22 2150  TempSrc:   PainSc: 0-No pain                 Stephanie Coup

## 2022-07-06 NOTE — ED Triage Notes (Signed)
Pt to ED from home for food bolus stuck in throat. Pt was eating chicken for dinner when his chicken got stuck in his throat. He feels like he is unable to pass it. Pt is unable to swallow anything at this time. Pt is CAOx4 and in no acute distress.

## 2022-07-06 NOTE — Anesthesia Procedure Notes (Signed)
Procedure Name: Intubation Date/Time: 07/06/2022 9:20 PM  Performed by: Stormy Fabian, CRNAPre-anesthesia Checklist: Patient identified, Patient being monitored, Timeout performed, Emergency Drugs available and Suction available Patient Re-evaluated:Patient Re-evaluated prior to induction Oxygen Delivery Method: Circle system utilized Preoxygenation: Pre-oxygenation with 100% oxygen Induction Type: IV induction Ventilation: Mask ventilation without difficulty Laryngoscope Size: Mac and 3 Grade View: Grade I Tube type: Oral Tube size: 7.0 mm Number of attempts: 1 Airway Equipment and Method: Stylet Placement Confirmation: ETT inserted through vocal cords under direct vision, positive ETCO2 and breath sounds checked- equal and bilateral Secured at: 21 cm Tube secured with: Tape Dental Injury: Teeth and Oropharynx as per pre-operative assessment

## 2022-07-06 NOTE — Transfer of Care (Signed)
Immediate Anesthesia Transfer of Care Note  Patient: Sean Norton  Procedure(s) Performed: Procedure(s): ESOPHAGOGASTRODUODENOSCOPY (EGD) (N/A)  Patient Location: PACU  Anesthesia Type:General  Level of Consciousness: sedated  Airway & Oxygen Therapy: Patient Spontanous Breathing and Patient connected to face mask oxygen  Post-op Assessment: Report given to RN and Post -op Vital signs reviewed and stable  Post vital signs: Reviewed and stable  Last Vitals:  Vitals:   07/06/22 2106 07/06/22 2140  BP: (!) 160/84 (!) 169/89  Pulse: 76 84  Resp: 18 19  Temp: (!) 36.1 C (!) 36.3 C  SpO2: 96% 96%    Complications: No apparent anesthesia complications

## 2022-07-06 NOTE — Anesthesia Preprocedure Evaluation (Addendum)
Anesthesia Evaluation  Patient identified by MRN, date of birth, ID band Patient awake    Reviewed: Allergy & Precautions, NPO status , Patient's Chart, lab work & pertinent test results  History of Anesthesia Complications Negative for: history of anesthetic complications  Airway Mallampati: III  TM Distance: >3 FB Neck ROM: full    Dental  (+) Chipped, Poor Dentition, Missing, Dental Advidsory Given   Pulmonary neg pulmonary ROS, neg shortness of breath   Pulmonary exam normal        Cardiovascular Exercise Tolerance: Good + angina  Normal cardiovascular exam     Neuro/Psych negative neurological ROS  negative psych ROS   GI/Hepatic negative GI ROS, Neg liver ROS,neg GERD  ,,  Endo/Other  negative endocrine ROS    Renal/GU Renal disease  negative genitourinary   Musculoskeletal   Abdominal   Peds  Hematology negative hematology ROS (+)   Anesthesia Other Findings Patient states he had one episode of shortness of breath that has not come back. He went to his PCP and had an EKG done. States that his PCP could not see anything wrong and referred him to his cardiologist in June. Patient denies any shortness of breath or chest pain tonight.    Past Medical History: No date: Kidney stones  Past Surgical History: No date: APPENDECTOMY 09/01/2014: CYSTOSCOPY WITH STENT PLACEMENT; Left     Comment:  Procedure: CYSTOSCOPY WITH STENT PLACEMENT;  Surgeon:               Sebastian Ache, MD;  Location: [facility] ORS;  Service:               Urology;  Laterality: Left; 09/01/2014: URETEROSCOPY WITH HOLMIUM LASER LITHOTRIPSY; Left     Comment:  Procedure: URETEROSCOPY WITH HOLMIUM LASER LITHOTRIPSY;               Surgeon: Sebastian Ache, MD;  Location: [facility] ORS;                Service: Urology;  Laterality: Left;  BMI    Body Mass Index: 34.50 kg/m      Reproductive/Obstetrics negative OB ROS                              Anesthesia Physical Anesthesia Plan  ASA: 2 and emergent  Anesthesia Plan: General ETT   Post-op Pain Management:    Induction: Intravenous  PONV Risk Score and Plan: Ondansetron, Dexamethasone and Midazolam  Airway Management Planned: Oral ETT  Additional Equipment:   Intra-op Plan:   Post-operative Plan: Extubation in OR  Informed Consent: I have reviewed the patients History and Physical, chart, labs and discussed the procedure including the risks, benefits and alternatives for the proposed anesthesia with the patient or authorized representative who has indicated his/her understanding and acceptance.     Dental Advisory Given  Plan Discussed with: Anesthesiologist, CRNA and Surgeon  Anesthesia Plan Comments: (Patient consented for risks of anesthesia including but not limited to:  - adverse reactions to medications - damage to eyes, teeth, lips or other oral mucosa - nerve damage due to positioning  - sore throat or hoarseness - Damage to heart, brain, nerves, lungs, other parts of body or loss of life  Patient voiced understanding.)        Anesthesia Quick Evaluation

## 2022-07-08 ENCOUNTER — Encounter: Payer: Self-pay | Admitting: Gastroenterology

## 2022-07-08 ENCOUNTER — Telehealth: Payer: Self-pay

## 2022-07-08 NOTE — Transitions of Care (Post Inpatient/ED Visit) (Signed)
   07/08/2022  Name: Sean Norton MRN: [id number] DOB: [birth year]  Today's TOC FU Call Status: Today's TOC FU Call Status:: Successful TOC FU Call Competed TOC FU Call Complete Date: 07/08/22  Transition Care Management Follow-up Telephone Call Date of Discharge: 07/06/22 Discharge Facility: [facility]) Type of Discharge: Inpatient Admission Primary Inpatient Discharge Diagnosis:: esophagus injury How have you been since you were released from the hospital?: Better Any questions or concerns?: No  Items Reviewed: Did you receive and understand the discharge instructions provided?: Yes Medications obtained,verified, and reconciled?: Yes (Medications Reviewed) Any new allergies since your discharge?: No Dietary orders reviewed?: Yes Do you have support at home?: No  Medications Reviewed Today: Medications Reviewed Today     Reviewed by Karena Addison, LPN (Licensed Practical Nurse) on 07/08/22 at 0915  Med List Status: <None>   Medication Order Taking? Sig Documenting Provider Last Dose Status Informant  pantoprazole (PROTONIX) 40 MG tablet [id number] Yes Take 1 tablet (40 mg total) by mouth daily. Dionne Bucy, MD Taking Active   Med List Note Casimer Leek, RN 09/01/14 0050): Pt reports taking no meds at home             Home Care and Equipment/Supplies: Were Home Health Services Ordered?: NA Any new equipment or medical supplies ordered?: NA  Functional Questionnaire: Do you need assistance with bathing/showering or dressing?: No Do you need assistance with meal preparation?: No Do you need assistance with eating?: No Do you have difficulty maintaining continence: No Do you need assistance with getting out of bed/getting out of a chair/moving?: No Do you have difficulty managing or taking your medications?: No  Follow up appointments reviewed: PCP Follow-up appointment confirmed?: NA Specialist Hospital Follow-up appointment  confirmed?: Yes Date of Specialist follow-up appointment?: 08/20/22 Follow-Up Specialty Provider:: GI Do you need transportation to your follow-up appointment?: No Do you understand care options if your condition(s) worsen?: Yes-patient verbalized understanding    SIGNATURE Karena Addison, LPN [facility] Nurse Health Advisor Direct Dial [phone]

## 2022-07-26 ENCOUNTER — Encounter: Payer: Self-pay | Admitting: Internal Medicine

## 2022-07-26 ENCOUNTER — Ambulatory Visit: Payer: Managed Care, Other (non HMO) | Attending: Internal Medicine | Admitting: Internal Medicine

## 2022-07-26 VITALS — BP 124/86 | HR 75 | Ht 75.0 in | Wt 274.6 lb

## 2022-07-26 DIAGNOSIS — R079 Chest pain, unspecified: Secondary | ICD-10-CM | POA: Diagnosis not present

## 2022-07-26 NOTE — Progress Notes (Signed)
Cardiology Office Note:    Date:  07/26/2022   ID:  Sean Norton, DOB [birth year], MRN [id number]  PCP:  Berniece Salines, FNP    HeartCare Providers Cardiologist:  None     Referring MD: Berniece Salines, FNP   No chief complaint on file. CP  History of Present Illness:    Sean Norton is a 62 y.o. male with a hx of for intermittent CP and L arm pain. He went to urgent care on 05/10/2022. He saw his primary provider in April. An EKG at that time was very normal. Had a cxray with describing tracheal deviation. He had a CT angio showing a calcified granuloma of the LUL. No sig CAC. Otherwise unremarkable. He was seen in the ED with c/f food stuck in his esophagus.  S/p EGD- patient noted they found a chicken bone. Today, he is here because prior he was having cp. Associated with GERD. He has no cardiac disease hx.  No CP or SOB Blood pressures are well controlled. Non smoker No DM2  Past Medical History:  Diagnosis Date   Kidney stones     Past Surgical History:  Procedure Laterality Date   APPENDECTOMY     COLONOSCOPY WITH PROPOFOL N/A 10/26/2021   Procedure: COLONOSCOPY WITH PROPOFOL;  Surgeon: Wyline Mood, MD;  Location: [facility] ENDOSCOPY;  Service: Gastroenterology;  Laterality: N/A;   CYSTOSCOPY WITH STENT PLACEMENT Left 09/01/2014   Procedure: CYSTOSCOPY WITH STENT PLACEMENT;  Surgeon: Sebastian Ache, MD;  Location: [facility] ORS;  Service: Urology;  Laterality: Left;   ESOPHAGOGASTRODUODENOSCOPY N/A 07/06/2022   Procedure: ESOPHAGOGASTRODUODENOSCOPY (EGD);  Surgeon: Wyline Mood, MD;  Location: [facility] ENDOSCOPY;  Service: Gastroenterology;  Laterality: N/A;   URETEROSCOPY WITH HOLMIUM LASER LITHOTRIPSY Left 09/01/2014   Procedure: URETEROSCOPY WITH HOLMIUM LASER LITHOTRIPSY;  Surgeon: Sebastian Ache, MD;  Location: [facility] ORS;  Service: Urology;  Laterality: Left;    Current Medications: Current Outpatient Medications on File Prior to Visit  Medication Sig Dispense Refill    pantoprazole (PROTONIX) 40 MG tablet Take 1 tablet (40 mg total) by mouth daily. 30 tablet 11   No current facility-administered medications on file prior to visit.    Allergies:   Patient has no known allergies.   Social History   Socioeconomic History   Marital status: Divorced    Spouse name: Not on file   Number of children: 5   Years of education: Not on file   Highest education level: Not on file  Occupational History   Not on file  Tobacco Use   Smoking status: Never   Smokeless tobacco: Never  Vaping Use   Vaping Use: Never used  Substance and Sexual Activity   Alcohol use: No   Drug use: No   Sexual activity: Yes  Other Topics Concern   Not on file  Social History Narrative   Not on file   Social Determinants of Health   Financial Resource Strain: Not on file  Food Insecurity: Not on file  Transportation Needs: Not on file  Physical Activity: Not on file  Stress: Not on file  Social Connections: Not on file     Family History: The patient's family history includes Kidney Stones in his father. There is no history of Diabetes.  ROS:   Please see the history of present illness.     All other systems reviewed and are negative.  EKGs/Labs/Other Studies Reviewed:    The following studies were reviewed today:   EKG:  EKG is  ordered today.  The ekg ordered today demonstrates   07/26/2022- NSR  Recent Labs: 09/10/2021: TSH 1.89 07/06/2022: ALT 27; BUN 18; Creatinine, Ser 0.99; Hemoglobin 15.0; Platelets 259; Potassium 3.7; Sodium 139   Recent Lipid Panel    Component Value Date/Time   CHOL 237 (H) 09/10/2021 0850   TRIG 145 09/10/2021 0850   HDL 47 09/10/2021 0850   CHOLHDL 5.0 (H) 09/10/2021 0850   LDLCALC 161 (H) 09/10/2021 0850     Risk Assessment/Calculations:     Physical Exam:    VS:   Vitals:   07/26/22 1009  BP: 124/86  Pulse: 75  SpO2: 95%    Wt Readings from Last 3 Encounters:  07/06/22 276 lb 0.3 oz (125.2 kg)  05/28/22  268 lb 3.2 oz (121.7 kg)  05/10/22 275 lb 9.2 oz (125 kg)     GEN:  Well nourished, well developed in no acute distress HEENT: Normal NECK: No JVD; No carotid bruits LYMPHATICS: No lymphadenopathy CARDIAC: RRR, no murmurs, rubs, gallops RESPIRATORY:  Clear to auscultation without rales, wheezing or rhonchi  ABDOMEN: Soft, non-tender, non-distended MUSCULOSKELETAL:  No edema; No deformity  SKIN: Warm and dry NEUROLOGIC:  Alert and oriented x 3 PSYCHIATRIC:  Normal affect   ASSESSMENT:   Non cardiac CP: CP c/f GERD. Follows with GI. No signs of ACS.   PLAN:    In order of problems listed above:  No further w/u           Medication Adjustments/Labs and Tests Ordered: Current medicines are reviewed at length with the patient today.  Concerns regarding medicines are outlined above.  No orders of the defined types were placed in this encounter.  No orders of the defined types were placed in this encounter.   There are no Patient Instructions on file for this visit.   Signed, Maisie Fus, MD  07/26/2022 7:35 AM    [facility]

## 2022-07-26 NOTE — Patient Instructions (Signed)
Medication Instructions:  Your physician recommends that you continue on your current medications as directed. Please refer to the Current Medication list given to you today.   *If you need a refill on your cardiac medications before your next appointment, please call your pharmacy*  Follow-Up: At [facility], you and your health needs are our priority.  As part of our continuing mission to provide you with exceptional heart care, we have created designated Provider Care Teams.  These Care Teams include your primary Cardiologist (physician) and Advanced Practice Providers (APPs -  Physician Assistants and Nurse Practitioners) who all work together to provide you with the care you need, when you need it.  We recommend signing up for the patient portal called "MyChart".  Sign up information is provided on this After Visit Summary.  MyChart is used to connect with patients for Virtual Visits (Telemedicine).  Patients are able to view lab/test results, encounter notes, upcoming appointments, etc.  Non-urgent messages can be sent to your provider as well.   To learn more about what you can do with MyChart, go to [email or web address].    Your next appointment:   As needed     Provider:   Branch, Mary E, MD     

## 2022-09-11 ENCOUNTER — Telehealth: Payer: Self-pay

## 2022-09-11 NOTE — Telephone Encounter (Signed)
Called patient to ask if he was ready to schedule the EGD that Dr. Tobi Bastos wanted to repeat to see if the treatment (Prilosec 40 MG daily for 3 months) had helped him. Unfortunately, I had to leave him a detailed message as he did not answer again. I left him my direct number and asked to call me back if he was ready to schedule.
